# Patient Record
Sex: Male | Born: 1985 | Race: Black or African American | Hispanic: No | Marital: Married | Smoking: Never smoker
Health system: Southern US, Community
[De-identification: ages and names within clinical notes are randomized; demographics above are authoritative.]

## PROBLEM LIST (undated history)

## (undated) DIAGNOSIS — C801 Malignant (primary) neoplasm, unspecified: Secondary | ICD-10-CM

## (undated) HISTORY — PX: FRACTURE SURGERY: SHX138

## (undated) HISTORY — PX: HERNIA REPAIR: SHX51

---

## 2004-01-22 ENCOUNTER — Emergency Department (HOSPITAL_COMMUNITY): Admission: EM | Admit: 2004-01-22 | Discharge: 2004-01-23 | Payer: Self-pay | Admitting: Emergency Medicine

## 2004-01-23 ENCOUNTER — Emergency Department (HOSPITAL_COMMUNITY): Admission: EM | Admit: 2004-01-23 | Discharge: 2004-01-23 | Payer: Self-pay | Admitting: Emergency Medicine

## 2004-02-25 ENCOUNTER — Emergency Department (HOSPITAL_COMMUNITY): Admission: EM | Admit: 2004-02-25 | Discharge: 2004-02-25 | Payer: Self-pay

## 2004-04-20 ENCOUNTER — Emergency Department (HOSPITAL_COMMUNITY): Admission: EM | Admit: 2004-04-20 | Discharge: 2004-04-20 | Payer: Self-pay | Admitting: Emergency Medicine

## 2004-11-06 ENCOUNTER — Emergency Department (HOSPITAL_COMMUNITY): Admission: EM | Admit: 2004-11-06 | Discharge: 2004-11-06 | Payer: Self-pay | Admitting: Emergency Medicine

## 2005-03-11 ENCOUNTER — Emergency Department (HOSPITAL_COMMUNITY): Admission: EM | Admit: 2005-03-11 | Discharge: 2005-03-11 | Payer: Self-pay | Admitting: Family Medicine

## 2005-03-29 ENCOUNTER — Emergency Department (HOSPITAL_COMMUNITY): Admission: EM | Admit: 2005-03-29 | Discharge: 2005-03-29 | Payer: Self-pay | Admitting: Emergency Medicine

## 2005-04-03 ENCOUNTER — Emergency Department (HOSPITAL_COMMUNITY): Admission: EM | Admit: 2005-04-03 | Discharge: 2005-04-03 | Payer: Self-pay | Admitting: Family Medicine

## 2005-07-17 ENCOUNTER — Emergency Department (HOSPITAL_COMMUNITY): Admission: EM | Admit: 2005-07-17 | Discharge: 2005-07-17 | Payer: Self-pay | Admitting: Emergency Medicine

## 2005-10-05 ENCOUNTER — Emergency Department (HOSPITAL_COMMUNITY): Admission: EM | Admit: 2005-10-05 | Discharge: 2005-10-05 | Payer: Self-pay | Admitting: Emergency Medicine

## 2007-01-30 ENCOUNTER — Emergency Department (HOSPITAL_COMMUNITY): Admission: EM | Admit: 2007-01-30 | Discharge: 2007-01-30 | Payer: Self-pay | Admitting: Emergency Medicine

## 2007-07-04 ENCOUNTER — Emergency Department (HOSPITAL_COMMUNITY): Admission: EM | Admit: 2007-07-04 | Discharge: 2007-07-04 | Payer: Self-pay | Admitting: Emergency Medicine

## 2007-10-10 ENCOUNTER — Emergency Department (HOSPITAL_COMMUNITY): Admission: EM | Admit: 2007-10-10 | Discharge: 2007-10-10 | Payer: Self-pay | Admitting: Emergency Medicine

## 2015-04-14 ENCOUNTER — Ambulatory Visit (INDEPENDENT_AMBULATORY_CARE_PROVIDER_SITE_OTHER): Payer: BLUE CROSS/BLUE SHIELD | Admitting: Family Medicine

## 2015-04-14 VITALS — BP 108/60 | HR 60 | Temp 98.7°F | Resp 16 | Ht 66.25 in | Wt 140.8 lb

## 2015-04-14 DIAGNOSIS — Z0000 Encounter for general adult medical examination without abnormal findings: Secondary | ICD-10-CM

## 2015-04-14 DIAGNOSIS — Z711 Person with feared health complaint in whom no diagnosis is made: Secondary | ICD-10-CM

## 2015-04-14 LAB — POCT CBC
Granulocyte percent: 57.3 %G (ref 37–80)
HCT, POC: 55.4 % — AB (ref 43.5–53.7)
Hemoglobin: 19.1 g/dL — AB (ref 14.1–18.1)
Lymph, poc: 1.7 (ref 0.6–3.4)
MCH, POC: 30 pg (ref 27–31.2)
MCHC: 34.6 g/dL (ref 31.8–35.4)
MCV: 86.9 fL (ref 80–97)
MID (cbc): 0.3 (ref 0–0.9)
MPV: 8.2 fL (ref 0–99.8)
POC Granulocyte: 2.7 (ref 2–6.9)
POC LYMPH PERCENT: 36.7 %L (ref 10–50)
POC MID %: 6 %M (ref 0–12)
Platelet Count, POC: 127 10*3/uL — AB (ref 142–424)
RBC: 6.37 M/uL — AB (ref 4.69–6.13)
RDW, POC: 13.3 %
WBC: 4.7 10*3/uL (ref 4.6–10.2)

## 2015-04-14 LAB — HIV ANTIBODY (ROUTINE TESTING W REFLEX): HIV 1&2 Ab, 4th Generation: NONREACTIVE

## 2015-04-14 LAB — COMPREHENSIVE METABOLIC PANEL
ALT: 51 U/L — ABNORMAL HIGH (ref 9–46)
AST: 32 U/L (ref 10–40)
Albumin: 4.8 g/dL (ref 3.6–5.1)
Alkaline Phosphatase: 79 U/L (ref 40–115)
BUN: 13 mg/dL (ref 7–25)
CO2: 27 mmol/L (ref 20–31)
Calcium: 9.2 mg/dL (ref 8.6–10.3)
Chloride: 103 mmol/L (ref 98–110)
Creat: 0.97 mg/dL (ref 0.60–1.35)
Glucose, Bld: 99 mg/dL (ref 65–99)
Potassium: 4.4 mmol/L (ref 3.5–5.3)
Sodium: 138 mmol/L (ref 135–146)
Total Bilirubin: 0.6 mg/dL (ref 0.2–1.2)
Total Protein: 7.2 g/dL (ref 6.1–8.1)

## 2015-04-14 LAB — LIPID PANEL
CHOL/HDL RATIO: 1.8 ratio (ref <=5.0)
Cholesterol: 150 mg/dL (ref 125–200)
HDL: 82 mg/dL (ref >=40)
LDL Cholesterol: 52 mg/dL (ref <130)
TRIGLYCERIDES: 79 mg/dL (ref <150)
VLDL: 16 mg/dL (ref <30)

## 2015-04-14 NOTE — Addendum Note (Signed)
Addended by: HOPPER, DAVID H on: 04/14/2015 02:38 PM   Modules accepted: Orders

## 2015-04-14 NOTE — Progress Notes (Signed)
Physical examination: History: Patient needs to get a physical exam for his insurance benefits. No other reasons for coming in.  Past history: Medications: None Allergies: None Past medical illnesses: None Surgeries: None  Social history: His mother died when he was 1, father was murdered when he was [age], lived with his grandmother died when he was about [age], he rates himself. He wants, lives with someone. No children of his own. He does not smoke, drink, or use drugs. Actually he does drink about 1 drink a week. He works at a [employer]. He attends church.  Family history: Mother died of breast cancer, although was murdered, no other major familial diseases  Review of systems: Constitutional: Unremarkable HEENT: Unremarkable Cardiovascular: Unremarkable Respiratory: Unremarkable Gastrointestinal: Unremarkable Genitourinary: Normal muscular skeletal: Dermatologic other than numerous tattoos unremarkable Endocrinologic: Normal Neurologic: Normal Psychiatric: Normal   Physical examination: Healthy-appearing young man, multiple tattoos all over him. His TMs are normal. Eyes PERRLA. EOMs intact. Throat clear. Neck supple without nodes or thyromegaly. No bruits. Chest is clear to auscultation. Heart regular without murmurs gallops or arrhythmias. Abdomen soft without masses or tenderness. Normal male external genitalia with testes descended. Right testicle is a little smaller than the left. Extremities unremarkable. Skin normal.  Assessment: Normal physical examination  Plan: Check labs. He did request going and in doing STD screening though he always uses protection. Return yearly or as needed.

## 2015-04-14 NOTE — Progress Notes (Deleted)
Patient ID: Marya LandryJahmari Babula, male    DOB: Jul 16, 1985  Age: 29 y.o. MRN: [id number]  Chief Complaint  Patient presents with  . Employment Physical    Needs insurance forms completed    Subjective:   ***  Current allergies, medications, problem list, past/family and social histories reviewed.  Objective:  BP 108/60 mmHg  Pulse 60  Temp(Src) 98.7 F (37.1 C) (Oral)  Resp 16  Ht 5' 6.25" (1.683 m)  Wt 140 lb 12.8 oz (63.866 kg)  BMI 22.55 kg/m2  SpO2 99%  ***  Assessment & Plan:   Assessment: No diagnosis found.    Plan: ***  No orders of the defined types were placed in this encounter.    No orders of the defined types were placed in this encounter.         There are no Patient Instructions on file for this visit.   No Follow-up on file.   HOPPER,DAVID, MD 04/14/2015

## 2015-04-15 LAB — RPR

## 2015-04-16 ENCOUNTER — Telehealth: Payer: Self-pay

## 2015-04-16 LAB — GC/CHLAMYDIA PROBE AMP
CT Probe RNA: NEGATIVE
GC Probe RNA: NEGATIVE

## 2015-04-16 NOTE — Telephone Encounter (Signed)
Pt says he left a physical form for us to fax once we received his lab work.  Medical Records- Do you have that form?

## 2015-04-17 NOTE — Telephone Encounter (Addendum)
Medical records does not hold on to forms (we have no where to keep them). If it's not in the the scan box, then the provider may have it. No PE form in scan box.

## 2015-04-18 NOTE — Telephone Encounter (Signed)
Dr. Alwyn RenHopper, do you have the form?

## 2016-04-19 ENCOUNTER — Ambulatory Visit (INDEPENDENT_AMBULATORY_CARE_PROVIDER_SITE_OTHER): Payer: BLUE CROSS/BLUE SHIELD | Admitting: Family Medicine

## 2016-04-19 VITALS — BP 98/60 | HR 51 | Temp 97.7°F | Resp 16 | Ht 65.5 in | Wt 154.0 lb

## 2016-04-19 DIAGNOSIS — Z0000 Encounter for general adult medical examination without abnormal findings: Secondary | ICD-10-CM

## 2016-04-19 LAB — LIPID PANEL
CHOL/HDL RATIO: 2.1 ratio (ref <5.0)
Cholesterol: 166 mg/dL (ref <200)
HDL: 79 mg/dL (ref >40)
LDL Cholesterol: 78 mg/dL (ref <100)
Triglycerides: 47 mg/dL (ref <150)
VLDL: 9 mg/dL (ref <30)

## 2016-04-19 LAB — GLUCOSE, POCT (MANUAL RESULT ENTRY): POC Glucose: 83 mg/dl (ref 70–99)

## 2016-04-19 LAB — POCT GLYCOSYLATED HEMOGLOBIN (HGB A1C): Hemoglobin A1C: 5.1

## 2016-04-19 NOTE — Patient Instructions (Addendum)
It was good to meet you today.  Go onto Mychart and you should be able to access your labs.  You can use this to complete your paperwork.    Let us know if you have any questions or concerns in the future. Otherwise we'll see you back next year.   Health Maintenance, Male A healthy lifestyle and preventative care can promote health and wellness.  Maintain regular health, dental, and eye exams.  Eat a healthy diet. Foods like vegetables, fruits, whole grains, low-fat dairy products, and lean protein foods contain the nutrients you need and are low in calories. Decrease your intake of foods high in solid fats, added sugars, and salt. Get information about a proper diet from your health care provider, if necessary.  Regular physical exercise is one of the most important things you can do for your health. Most adults should get at least 150 minutes of moderate-intensity exercise (any activity that increases your heart rate and causes you to sweat) each week. In addition, most adults need muscle-strengthening exercises on 2 or more days a week.   Maintain a healthy weight. The body mass index (BMI) is a screening tool to identify possible weight problems. It provides an estimate of body fat based on height and weight. Your health care provider can find your BMI and can help you achieve or maintain a healthy weight. For males 20 years and older:  A BMI below 18.5 is considered underweight.  A BMI of 18.5 to 24.9 is normal.  A BMI of 25 to 29.9 is considered overweight.  A BMI of 30 and above is considered obese.  Maintain normal blood lipids and cholesterol by exercising and minimizing your intake of saturated fat. Eat a balanced diet with plenty of fruits and vegetables. Blood tests for lipids and cholesterol should begin at age 65 and be repeated every 5 years. If your lipid or cholesterol levels are high, you are over age 22, or you are at high risk for heart disease, you may need your  cholesterol levels checked more frequently.Ongoing high lipid and cholesterol levels should be treated with medicines if diet and exercise are not working.  If you smoke, find out from your health care provider how to quit. If you do not use tobacco, do not start.  Lung cancer screening is recommended for adults aged 55-80 years who are at high risk for developing lung cancer because of a history of smoking. A yearly low-dose CT scan of the lungs is recommended for people who have at least a 30-pack-year history of smoking and are current smokers or have quit within the past 15 years. A pack year of smoking is smoking an average of 1 pack of cigarettes a day for 1 year (for example, a 30-pack-year history of smoking could mean smoking 1 pack a day for 30 years or 2 packs a day for 15 years). Yearly screening should continue until the smoker has stopped smoking for at least 15 years. Yearly screening should be stopped for people who develop a health problem that would prevent them from having lung cancer treatment.  If you choose to drink alcohol, do not have more than 2 drinks per day. One drink is considered to be 12 oz (360 mL) of beer, 5 oz (150 mL) of wine, or 1.5 oz (45 mL) of liquor.  Avoid the use of street drugs. Do not share needles with anyone. Ask for help if you need support or instructions about stopping the use  of drugs.  High blood pressure causes heart disease and increases the risk of stroke. High blood pressure is more likely to develop in:  People who have blood pressure in the end of the normal range (100-139/85-89 mm Hg).  People who are overweight or obese.  People who are African American.  If you are 6218-32 years of age, have your blood pressure checked every 3-5 years. If you are 32 years of age or older, have your blood pressure checked every year. You should have your blood pressure measured twice-once when you are at a hospital or clinic, and once when you are not at a  hospital or clinic. Record the average of the two measurements. To check your blood pressure when you are not at a hospital or clinic, you can use:  An automated blood pressure machine at a pharmacy.  A home blood pressure monitor.  If you are 7045-746 years old, ask your health care provider if you should take aspirin to prevent heart disease.  Diabetes screening involves taking a blood sample to check your fasting blood sugar level. This should be done once every 3 years after age 32 if you are at a normal weight and without risk factors for diabetes. Testing should be considered at a younger age or be carried out more frequently if you are overweight and have at least 1 risk factor for diabetes.  Colorectal cancer can be detected and often prevented. Most routine colorectal cancer screening begins at the age of 32 and continues through age 32. However, your health care provider may recommend screening at an earlier age if you have risk factors for colon cancer. On a yearly basis, your health care provider may provide home test kits to check for hidden blood in the stool. A small camera at the end of a tube may be used to directly examine the colon (sigmoidoscopy or colonoscopy) to detect the earliest forms of colorectal cancer. Talk to your health care provider about this at age 32 when routine screening begins. A direct exam of the colon should be repeated every 5-10 years through age 32, unless early forms of precancerous polyps or small growths are found.  People who are at an increased risk for hepatitis B should be screened for this virus. You are considered at high risk for hepatitis B if:  You were born in a country where hepatitis B occurs often. Talk with your health care provider about which countries are considered high risk.  Your parents were born in a high-risk country and you have not received a shot to protect against hepatitis B (hepatitis B vaccine).  You have HIV or AIDS.  You  use needles to inject street drugs.  You live with, or have sex with, someone who has hepatitis B.  You are a man who has sex with other men (MSM).  You get hemodialysis treatment.  You take certain medicines for conditions like cancer, organ transplantation, and autoimmune conditions.  Hepatitis C blood testing is recommended for all people born from [id number] through 1965 and any individual with known risk factors for hepatitis C.  Healthy men should no longer receive prostate-specific antigen (PSA) blood tests as part of routine cancer screening. Talk to your health care provider about prostate cancer screening.  Testicular cancer screening is not recommended for adolescents or adult males who have no symptoms. Screening includes self-exam, a health care provider exam, and other screening tests. Consult with your health care provider about any symptoms you  have or any concerns you have about testicular cancer.  Practice safe sex. Use condoms and avoid high-risk sexual practices to reduce the spread of sexually transmitted infections (STIs).  You should be screened for STIs, including gonorrhea and chlamydia if:  You are sexually active and are younger than 24 years.  You are older than 24 years, and your health care provider tells you that you are at risk for this type of infection.  Your sexual activity has changed since you were last screened, and you are at an increased risk for chlamydia or gonorrhea. Ask your health care provider if you are at risk.  If you are at risk of being infected with HIV, it is recommended that you take a prescription medicine daily to prevent HIV infection. This is called pre-exposure prophylaxis (PrEP). You are considered at risk if:  You are a man who has sex with other men (MSM).  You are a heterosexual man who is sexually active with multiple partners.  You take drugs by injection.  You are sexually active with a partner who has HIV.  Talk with  your health care provider about whether you are at high risk of being infected with HIV. If you choose to begin PrEP, you should first be tested for HIV. You should then be tested every 3 months for as long as you are taking PrEP.  Use sunscreen. Apply sunscreen liberally and repeatedly throughout the day. You should seek shade when your shadow is shorter than you. Protect yourself by wearing long sleeves, pants, a wide-brimmed hat, and sunglasses year round whenever you are outdoors.  Tell your health care provider of new moles or changes in moles, especially if there is a change in shape or color. Also, tell your health care provider if a mole is larger than the size of a pencil eraser.  A one-time screening for abdominal aortic aneurysm (AAA) and surgical repair of large AAAs by ultrasound is recommended for men aged 65-75 years who are current or former smokers.  Stay current with your vaccines (immunizations). This information is not intended to replace advice given to you by your health care provider. Make sure you discuss any questions you have with your health care provider. Document Released: 11/15/2007 Document Revised: 06/09/2014 Document Reviewed: 02/20/2015 Elsevier Interactive Patient Education  2017 [employer]     IF you received an x-ray today, you will receive an invoice from [facility]. Please contact [facility] at [phone] with questions or concerns regarding your invoice.   IF you received labwork today, you will receive an invoice from [facility]. Please contact [facility] at [phone] with questions or concerns regarding your invoice.   Our billing staff will not be able to assist you with questions regarding bills from these companies.  You will be contacted with the lab results as soon as they are available. The fastest way to get your results is to activate your My Chart account. Instructions are located on the  last page of this paperwork. If you have not heard from us regarding the results in 2 weeks, please contact this office.

## 2016-04-19 NOTE — Progress Notes (Signed)
   Travis Pham is a 30 y.o. male who presents to [facility] today for comprehensive physical examination:  CPE:  Annual work exam.  Needs biometrics completed.    Concerns:  None, doing well.  Healthy   Last physical last year Eye exam:  yearly Dental exam every six months.   PMH reviewed. Patient is a nonsmoker.   No past medical history on file. No past surgical history on file.  Medications reviewed. No current outpatient prescriptions on file.   No current facility-administered medications for this visit.     Social: Smoking history:  denies Alcohol use:  denies Illicit drug use:  denies  Family History:  Mother with cancer, otherwise negative.   Review of Systems  Constitutional: Negative for fever.  HENT: Negative for congestion, ear discharge, ear pain and hearing loss.   Eyes: Negative for blurred vision.  Respiratory: Negative for cough and wheezing.   Cardiovascular: Negative for chest pain, palpitations and leg swelling.  Gastrointestinal: Negative for nausea, vomiting and abdominal pain.  Genitourinary: Negative for dysuria, hematuria and flank pain.  Musculoskeletal: Negative for neck pain.  Skin: Negative for rash.  Neurological: Negative for dizziness and headaches.  Psychiatric/Behavioral: Negative for depression and suicidal ideas.   Exam: BP 98/60   Pulse (!) 51   Temp 97.7 F (36.5 C) (Oral)   Resp 16   Ht 5' 5.5" (1.664 m)   Wt 154 lb (69.9 kg)   SpO2 99%   BMI 25.24 kg/m  Gen:  Alert, cooperative patient who appears stated age in no acute distress.  Vital signs reviewed. Head: [place]/AT.   Eyes:  EOMI, PERRL.   Ears:  External ears WNL, Bilateral TM's normal without retraction, redness or bulging. Nose:  Septum midline  Mouth:  MMM, tonsils non-erythematous, non-edematous.   Neck: No masses or thyromegaly or limitation in range of motion.  No cervical lymphadenopathy. Pulm:  Clear to auscultation bilaterally with  good air movement.  No wheezes or rales noted.   Cardiac:  Regular rate and rhythm without murmur auscultated.  Good S1/S2. Abd:  Soft/nondistended/nontender.  Good bowel sounds throughout all four quadrants.  No masses noted.  Ext:  No clubbing/cyanosis/erythema.  No edema noted bilateral lower extremities.   Neuro:  Grossly normal, no gait abnormalities Psych:  Not depressed or anxious appearing.  Conversant and engaged  Impression/Plan: 1. Complete Physical Examination: completed paperowork and obtained labs/biometrics. 2..  Screening cholesterol: obtain FLP.  Patient can complete rest of his paperwork from Mychart with results. 3.  CBG here was <100, which was goal based on his work paperwork.

## 2016-09-03 ENCOUNTER — Other Ambulatory Visit: Payer: Self-pay | Admitting: Family Medicine

## 2016-09-03 DIAGNOSIS — N5089 Other specified disorders of the male genital organs: Secondary | ICD-10-CM

## 2016-09-05 ENCOUNTER — Ambulatory Visit
Admission: RE | Admit: 2016-09-05 | Discharge: 2016-09-05 | Disposition: A | Discharge status: Home / Self Care | Payer: BLUE CROSS/BLUE SHIELD | Source: Ambulatory Visit | Attending: Family Medicine | Admitting: Family Medicine

## 2016-09-05 DIAGNOSIS — N5089 Other specified disorders of the male genital organs: Secondary | ICD-10-CM

## 2016-09-09 ENCOUNTER — Other Ambulatory Visit: Payer: Self-pay

## 2017-03-28 ENCOUNTER — Encounter: Payer: Self-pay | Admitting: Physician Assistant

## 2017-03-28 ENCOUNTER — Ambulatory Visit (INDEPENDENT_AMBULATORY_CARE_PROVIDER_SITE_OTHER): Payer: BLUE CROSS/BLUE SHIELD | Admitting: Physician Assistant

## 2017-03-28 VITALS — BP 108/72 | HR 60 | Temp 98.4°F | Resp 18 | Ht 66.34 in | Wt 160.6 lb

## 2017-03-28 DIAGNOSIS — Z13220 Encounter for screening for lipoid disorders: Secondary | ICD-10-CM | POA: Diagnosis not present

## 2017-03-28 DIAGNOSIS — Z131 Encounter for screening for diabetes mellitus: Secondary | ICD-10-CM

## 2017-03-28 DIAGNOSIS — Z0000 Encounter for general adult medical examination without abnormal findings: Secondary | ICD-10-CM

## 2017-03-28 LAB — POCT GLYCOSYLATED HEMOGLOBIN (HGB A1C): HEMOGLOBIN A1C: 5.2

## 2017-03-28 NOTE — Patient Instructions (Addendum)
Keep up the great work!   Health Maintenance, Male A healthy lifestyle and preventive care is important for your health and wellness. Ask your health care provider about what schedule of regular examinations is right for you. What should I know about weight and diet? Eat a Healthy Diet  Eat plenty of vegetables, fruits, whole grains, low-fat dairy products, and lean protein.  Do not eat a lot of foods high in solid fats, added sugars, or salt.  Maintain a Healthy Weight Regular exercise can help you achieve or maintain a healthy weight. You should:  Do at least 150 minutes of exercise each week. The exercise should increase your heart rate and make you sweat (moderate-intensity exercise).  Do strength-training exercises at least twice a week.  Watch Your Levels of Cholesterol and Blood Lipids  Have your blood tested for lipids and cholesterol every 5 years starting at 33 years of age. If you are at high risk for heart disease, you should start having your blood tested when you are 33 years old. You may need to have your cholesterol levels checked more often if: ? Your lipid or cholesterol levels are high. ? You are older than 33 years of age. ? You are at high risk for heart disease.  What should I know about cancer screening? Many types of cancers can be detected early and may often be prevented. Lung Cancer  You should be screened every year for lung cancer if: ? You are a current smoker who has smoked for at least 30 years. ? You are a former smoker who has quit within the past 15 years.  Talk to your health care provider about your screening options, when you should start screening, and how often you should be screened.  Colorectal Cancer  Routine colorectal cancer screening usually begins at 33 years of age and should be repeated every 5-10 years until you are 33 years old. You may need to be screened more often if early forms of precancerous polyps or small growths are  found. Your health care provider may recommend screening at an earlier age if you have risk factors for colon cancer.  Your health care provider may recommend using home test kits to check for hidden blood in the stool.  A small camera at the end of a tube can be used to examine your colon (sigmoidoscopy or colonoscopy). This checks for the earliest forms of colorectal cancer.  Prostate and Testicular Cancer  Depending on your age and overall health, your health care provider may do certain tests to screen for prostate and testicular cancer.  Talk to your health care provider about any symptoms or concerns you have about testicular or prostate cancer.  Skin Cancer  Check your skin from head to toe regularly.  Tell your health care provider about any new moles or changes in moles, especially if: ? There is a change in a mole's size, shape, or color. ? You have a mole that is larger than a pencil eraser.  Always use sunscreen. Apply sunscreen liberally and repeat throughout the day.  Protect yourself by wearing long sleeves, pants, a wide-brimmed hat, and sunglasses when outside.  What should I know about heart disease, diabetes, and high blood pressure?  If you are 37-78 years of age, have your blood pressure checked every 3-5 years. If you are 65 years of age or older, have your blood pressure checked every year. You should have your blood pressure measured twice-once when you are at  a hospital or clinic, and once when you are not at a hospital or clinic. Record the average of the two measurements. To check your blood pressure when you are not at a hospital or clinic, you can use: ? An automated blood pressure machine at a pharmacy. ? A home blood pressure monitor.  Talk to your health care provider about your target blood pressure.  If you are between 1145-33 years old, ask your health care provider if you should take aspirin to prevent heart disease.  Have regular diabetes  screenings by checking your fasting blood sugar level. ? If you are at a normal weight and have a low risk for diabetes, have this test once every three years after the age of 33. ? If you are overweight and have a high risk for diabetes, consider being tested at a younger age or more often.  A one-time screening for abdominal aortic aneurysm (AAA) by ultrasound is recommended for men aged 65-75 years who are current or former smokers. What should I know about preventing infection? Hepatitis B If you have a higher risk for hepatitis B, you should be screened for this virus. Talk with your health care provider to find out if you are at risk for hepatitis B infection. Hepatitis C Blood testing is recommended for:  Everyone born from [birth year] through 1965.  Anyone with known risk factors for hepatitis C.  Sexually Transmitted Diseases (STDs)  You should be screened each year for STDs including gonorrhea and chlamydia if: ? You are sexually active and are younger than 33 years of age. ? You are older than 33 years of age and your health care provider tells you that you are at risk for this type of infection. ? Your sexual activity has changed since you were last screened and you are at an increased risk for chlamydia or gonorrhea. Ask your health care provider if you are at risk.  Talk with your health care provider about whether you are at high risk of being infected with HIV. Your health care provider may recommend a prescription medicine to help prevent HIV infection.  What else can I do?  Schedule regular health, dental, and eye exams.  Stay current with your vaccines (immunizations).  Do not use any tobacco products, such as cigarettes, chewing tobacco, and e-cigarettes. If you need help quitting, ask your health care provider.  Limit alcohol intake to no more than 2 drinks per day. One drink equals 12 ounces of beer, 5 ounces of wine, or 1 ounces of hard liquor.  Do not use street  drugs.  Do not share needles.  Ask your health care provider for help if you need support or information about quitting drugs.  Tell your health care provider if you often feel depressed.  Tell your health care provider if you have ever been abused or do not feel safe at home. This information is not intended to replace advice given to you by your health care provider. Make sure you discuss any questions you have with your health care provider. Document Released: 11/15/2007 Document Revised: 01/16/2016 Document Reviewed: 02/20/2015 Elsevier Interactive Patient Education  2018 [employer]   IF you received an x-ray today, you will receive an invoice from [facility]. Please contact [facility] at [phone] with questions or concerns regarding your invoice.   IF you received labwork today, you will receive an invoice from [facility]. Please contact [facility] at [phone] with questions or concerns regarding your invoice.   Our  billing staff will not be able to assist you with questions regarding bills from these companies.  You will be contacted with the lab results as soon as they are available. The fastest way to get your results is to activate your My Chart account. Instructions are located on the last page of this paperwork. If you have not heard from [place] regarding the results in 2 weeks, please contact this office.
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
## 2017-03-29 LAB — LIPID PANEL
CHOLESTEROL TOTAL: 157 mg/dL (ref 100–199)
Chol/HDL Ratio: 2.3 ratio (ref 0.0–5.0)
HDL: 67 mg/dL (ref >39)
LDL CALC: 78 mg/dL (ref 0–99)
Triglycerides: 59 mg/dL (ref 0–149)
VLDL Cholesterol Cal: 12 mg/dL (ref 5–40)

## 2017-03-30 ENCOUNTER — Telehealth: Payer: Self-pay | Admitting: Family Medicine

## 2017-03-31 NOTE — Telephone Encounter (Signed)
I have printed off the results and he can come pick them up at [address].

## 2017-03-31 NOTE — Telephone Encounter (Signed)
Need lab results for insurance

## 2017-03-31 NOTE — Telephone Encounter (Signed)
lmom for pt to come an pick up a copy of labs at the [address] by 6

## 2017-03-31 NOTE — Telephone Encounter (Signed)
Pt calling again about results.  

## 2017-04-27 NOTE — Addendum Note (Signed)
Addended by: Benjiman CoreWISEMAN, Annye Forrey D on: 04/27/2017 07:29 PM   Modules accepted: Level of Service

## 2017-05-05 ENCOUNTER — Ambulatory Visit (INDEPENDENT_AMBULATORY_CARE_PROVIDER_SITE_OTHER): Payer: Self-pay

## 2017-05-05 ENCOUNTER — Other Ambulatory Visit: Payer: Self-pay | Admitting: Gerontology

## 2017-05-05 DIAGNOSIS — S62316A Displaced fracture of base of fifth metacarpal bone, right hand, initial encounter for closed fracture: Secondary | ICD-10-CM

## 2017-05-05 DIAGNOSIS — R609 Edema, unspecified: Secondary | ICD-10-CM

## 2017-05-05 DIAGNOSIS — X58XXXA Exposure to other specified factors, initial encounter: Secondary | ICD-10-CM

## 2017-06-15 ENCOUNTER — Encounter (HOSPITAL_COMMUNITY): Payer: Self-pay

## 2017-06-15 ENCOUNTER — Emergency Department (HOSPITAL_COMMUNITY): Payer: BLUE CROSS/BLUE SHIELD

## 2017-06-15 ENCOUNTER — Emergency Department (HOSPITAL_COMMUNITY)
Admission: EM | Admit: 2017-06-15 | Discharge: 2017-06-15 | Disposition: A | Discharge status: Home / Self Care | Payer: BLUE CROSS/BLUE SHIELD | Attending: Emergency Medicine | Admitting: Emergency Medicine

## 2017-06-15 DIAGNOSIS — M25512 Pain in left shoulder: Secondary | ICD-10-CM | POA: Diagnosis present

## 2017-06-15 DIAGNOSIS — Z79899 Other long term (current) drug therapy: Secondary | ICD-10-CM | POA: Insufficient documentation

## 2017-06-15 MED ORDER — CYCLOBENZAPRINE HCL 10 MG PO TABS
10.0000 mg | ORAL_TABLET | Freq: Once | ORAL | Status: AC
  Administered 2017-06-15: 10 mg via ORAL
  Filled 2017-06-15: qty 1

## 2017-06-15 MED ORDER — DICLOFENAC SODIUM 50 MG PO TBEC: 50.0000 mg | DELAYED_RELEASE_TABLET | Freq: Two times a day (BID) | ORAL | 0 refills | Status: DC

## 2017-06-15 MED ORDER — METHOCARBAMOL 500 MG PO TABS: 500.0000 mg | ORAL_TABLET | Freq: Two times a day (BID) | ORAL | 0 refills | Status: DC

## 2017-06-15 MED ORDER — IBUPROFEN 200 MG PO TABS
600.0000 mg | ORAL_TABLET | Freq: Once | ORAL | Status: AC
  Administered 2017-06-15: 600 mg via ORAL
  Filled 2017-06-15: qty 3
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
## 2017-06-15 NOTE — Discharge Instructions (Signed)
Follow up with your doctor. Return here for worsening symptoms.  Do not drive while taking the muscle relaxer as it will make you sleepy.
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
## 2017-06-15 NOTE — ED Notes (Signed)
Bed: WTR5 Expected date:  Expected time:  Means of arrival:  Comments: 

## 2019-03-08 ENCOUNTER — Ambulatory Visit (INDEPENDENT_AMBULATORY_CARE_PROVIDER_SITE_OTHER): Payer: No Typology Code available for payment source | Admitting: Sports Medicine

## 2019-03-08 ENCOUNTER — Other Ambulatory Visit: Payer: Self-pay

## 2019-03-08 ENCOUNTER — Encounter: Payer: Self-pay | Admitting: Sports Medicine

## 2019-03-08 VITALS — BP 129/76

## 2019-03-08 DIAGNOSIS — M79674 Pain in right toe(s): Secondary | ICD-10-CM

## 2019-03-08 DIAGNOSIS — B351 Tinea unguium: Secondary | ICD-10-CM | POA: Diagnosis not present

## 2019-03-08 DIAGNOSIS — M79675 Pain in left toe(s): Secondary | ICD-10-CM | POA: Diagnosis not present

## 2019-03-08 NOTE — Progress Notes (Signed)
Subjective: Turner Baillie is a 33 y.o. male patient seen today in office with complaint of mildly painful thickened and discolored nails. Patient is desiring treatment for nail changes; has tried OTC topicals/Medication in the past with no improvement. Reports that nails are becoming difficult to manage because of the thickness and reports that he had issues with itching of the skin and peeling but that has resolved since he saw his PCP and another doctor who prescribed him medication patient reports that he does not want any lotion or cream he wants the strongest medication that can help to clear this up reports that the changes are worse on his right foot greater than the left.  Patient works in work boots. Patient has no other pedal complaints at this time.   Review of Systems  All other systems reviewed and are negative.   There are no active problems to display for this patient.   Current Outpatient Medications on File Prior to Visit  Medication Sig Dispense Refill  . clomiPHENE (CLOMID) 50 MG tablet Take 25 mg by mouth daily.  5  . diclofenac (VOLTAREN) 50 MG EC tablet Take 1 tablet (50 mg total) by mouth 2 (two) times daily. 15 tablet 0  . methocarbamol (ROBAXIN) 500 MG tablet Take 1 tablet (500 mg total) by mouth 2 (two) times daily. 20 tablet 0   No current facility-administered medications on file prior to visit.     No Known Allergies  Objective: Physical Exam  General: Well developed, nourished, no acute distress, awake, alert and oriented x 3  Vascular: Dorsalis pedis artery 2/4 bilateral, Posterior tibial artery 1/4 bilateral, skin temperature warm to warm proximal to distal bilateral lower extremities, no varicosities, pedal hair present bilateral.  Neurological: Gross sensation present via light touch bilateral.   Dermatological: Skin is warm, dry, and supple bilateral, Nails 1-10 are tender, short thick, and discolored with mild subungal debris, scaly skin plantar  surfaces bilateral, no webspace macerations present bilateral, no open lesions present bilateral, no callus/corns/hyperkeratotic tissue present bilateral. No signs of infection bilateral.  Musculoskeletal: No symptomatic boney deformities noted bilateral. Muscular strength within normal limits without painon range of motion. No pain with calf compression bilateral.  Assessment and Plan:  Problem List Items Addressed This Visit    None    Visit Diagnoses    Nail fungus    -  Primary   Relevant Orders   Hepatic Function Panel   Culture, fungus without smear   Toe pain, bilateral          -Examined patient -Discussed treatment options for painful dystrophic nails  -Fungal culture was obtained by removing a portion of the hard nail itself from each of the involved toenails using a sterile nail nipper and sent to [facility] lab. Patient tolerated the biopsy procedure well without discomfort or need for anesthesia.   -Ordered liver function panel and advised patient that if this comes back normal can go ahead and start Lamisil until fungal culture results are available -Patient to return in 4 weeks for follow up evaluation and discussion of fungal culture results or sooner if symptoms worsen.  Landis Martins, DPM

## 2019-03-09 ENCOUNTER — Other Ambulatory Visit: Payer: Self-pay

## 2019-03-09 ENCOUNTER — Telehealth: Payer: Self-pay

## 2019-03-09 ENCOUNTER — Other Ambulatory Visit: Payer: Self-pay | Admitting: Sports Medicine

## 2019-03-09 LAB — HEPATIC FUNCTION PANEL
AG Ratio: 2.3 (calc) (ref 1.0–2.5)
ALT: 43 U/L (ref 9–46)
AST: 27 U/L (ref 10–40)
Albumin: 4.8 g/dL (ref 3.6–5.1)
Alkaline phosphatase (APISO): 67 U/L (ref 36–130)
Bilirubin, Direct: 0.1 mg/dL (ref 0.0–0.2)
Globulin: 2.1 g/dL (calc) (ref 1.9–3.7)
Indirect Bilirubin: 0.4 mg/dL (calc) (ref 0.2–1.2)
Total Bilirubin: 0.5 mg/dL (ref 0.2–1.2)
Total Protein: 6.9 g/dL (ref 6.1–8.1)

## 2019-03-09 MED ORDER — TERBINAFINE HCL 250 MG PO TABS: 250.0000 mg | ORAL_TABLET | Freq: Every day | ORAL | 0 refills | Status: DC

## 2019-03-09 NOTE — Progress Notes (Signed)
LFTs normal lamisil sent to his pharmacy -Dr. Cannon Kettle

## 2019-03-09 NOTE — Telephone Encounter (Signed)
Spoke to Pt about his lab results, pt stated understanding about the results and the Dr's instructions

## 2019-03-09 NOTE — Telephone Encounter (Signed)
-----   Message from Travis Pham, [place] sent at 03/09/2019  7:49 AM EDT ----- Will you let patient know that his LFTs are normal and I have sent Lamisil to his pharmacy Thanks Dr. Cannon Kettle

## 2019-03-20 ENCOUNTER — Encounter: Payer: Self-pay | Admitting: Sports Medicine

## 2019-04-12 ENCOUNTER — Encounter: Payer: Self-pay | Admitting: Sports Medicine

## 2019-04-12 ENCOUNTER — Other Ambulatory Visit: Payer: Self-pay

## 2019-04-12 ENCOUNTER — Ambulatory Visit (INDEPENDENT_AMBULATORY_CARE_PROVIDER_SITE_OTHER): Payer: No Typology Code available for payment source | Admitting: Sports Medicine

## 2019-04-12 DIAGNOSIS — M79675 Pain in left toe(s): Secondary | ICD-10-CM

## 2019-04-12 DIAGNOSIS — B351 Tinea unguium: Secondary | ICD-10-CM | POA: Diagnosis not present

## 2019-04-12 DIAGNOSIS — M79674 Pain in right toe(s): Secondary | ICD-10-CM | POA: Diagnosis not present

## 2019-04-12 LAB — HEPATIC FUNCTION PANEL
AG Ratio: 2 (calc) (ref 1.0–2.5)
ALT: 57 U/L — ABNORMAL HIGH (ref 9–46)
AST: 36 U/L (ref 10–40)
Albumin: 4.5 g/dL (ref 3.6–5.1)
Alkaline phosphatase (APISO): 59 U/L (ref 36–130)
Bilirubin, Direct: 0.1 mg/dL (ref 0.0–0.2)
Globulin: 2.2 g/dL (calc) (ref 1.9–3.7)
Indirect Bilirubin: 0.3 mg/dL (calc) (ref 0.2–1.2)
Total Bilirubin: 0.4 mg/dL (ref 0.2–1.2)
Total Protein: 6.7 g/dL (ref 6.1–8.1)

## 2019-04-12 NOTE — Progress Notes (Signed)
Subjective: Travis Pham is a 33 y.o. male patient seen today in office for fungal culture results. Patient has no other pedal complaints at this time.  Currently on oral Lamisil without any acute problems or symptoms.  There are no active problems to display for this patient.   Current Outpatient Medications on File Prior to Visit  Medication Sig Dispense Refill  . clomiPHENE (CLOMID) 50 MG tablet Take 25 mg by mouth daily.  5  . diclofenac (VOLTAREN) 50 MG EC tablet Take 1 tablet (50 mg total) by mouth 2 (two) times daily. 15 tablet 0  . methocarbamol (ROBAXIN) 500 MG tablet Take 1 tablet (500 mg total) by mouth 2 (two) times daily. 20 tablet 0  . terbinafine (LAMISIL) 250 MG tablet Take 1 tablet (250 mg total) by mouth daily. 90 tablet 0   No current facility-administered medications on file prior to visit.     No Known Allergies  Objective: Physical Exam  General: Well developed, nourished, no acute distress, awake, alert and oriented x 3  Vascular: Dorsalis pedis artery 2/4 bilateral, Posterior tibial artery 2/4 bilateral, skin temperature warm to warm proximal to distal bilateral lower extremities, no varicosities, pedal hair present bilateral.  Neurological: Gross sensation present via light touch bilateral.   Dermatological: Skin is warm, dry, and supple bilateral, Nails 1-10 are tender, short thick, and discolored with mild subungal debris, no webspace macerations present bilateral, no open lesions present bilateral, no callus/corns/hyperkeratotic tissue present bilateral. No signs of infection bilateral.  Musculoskeletal: Early bunion and hammertoe boney deformities noted bilateral. Muscular strength within normal limits without painon range of motion. No pain with calf compression bilateral.  Fungal culture + T Rubrum  Assessment and Plan:  Problem List Items Addressed This Visit    None    Visit Diagnoses    Nail fungus    -  Primary   Toe pain, bilateral           -Examined patient -Discussed treatment options for painful mycotic nails -Continue with Lamisil with full understanding of medication risks; ordered LFTs for review.  -Patient opt for laser as well to start next month -Advised good hygiene habits -Patient to return in 6 weeks for follow up evaluation/med check or sooner if symptoms worsen.  Landis Martins, DPM

## 2019-04-13 NOTE — Progress Notes (Signed)
This is for dr. Cannon Kettle. May want to repeat in next 3 months

## 2019-04-15 NOTE — Telephone Encounter (Signed)
-----   Message from Wallene Huh, DPM sent at 04/13/2019  1:33 PM EST ----- This is for dr. Cannon Kettle. May want to repeat in next 3 months

## 2019-05-13 ENCOUNTER — Ambulatory Visit: Payer: Self-pay | Admitting: *Deleted

## 2019-05-13 ENCOUNTER — Other Ambulatory Visit: Payer: Self-pay

## 2019-05-13 ENCOUNTER — Encounter: Payer: Self-pay | Admitting: Sports Medicine

## 2019-05-13 DIAGNOSIS — B351 Tinea unguium: Secondary | ICD-10-CM

## 2019-05-13 NOTE — Patient Instructions (Signed)

## 2019-05-13 NOTE — Progress Notes (Signed)
Patient presents today for the 1st laser treatment. Diagnosed with mycotic nail infection by Dr. Cannon Kettle. Toenail most affected is the hallux right. Patient is already pleased with the progress of his toenail since he's been taking an oral antifungal.  All other systems are negative.  Nails were filed thin. Laser therapy was administered to 1-5 toenails bilateral and patient tolerated the treatment well. All safety precautions were in place.   Patient is also on oral terbinafine.  Follow up in 4 weeks for laser # 2.

## 2019-05-24 ENCOUNTER — Encounter: Payer: Self-pay | Admitting: Sports Medicine

## 2019-05-24 ENCOUNTER — Ambulatory Visit: Payer: Managed Care, Other (non HMO) | Admitting: Sports Medicine

## 2019-05-24 ENCOUNTER — Other Ambulatory Visit: Payer: Self-pay

## 2019-05-24 DIAGNOSIS — M79674 Pain in right toe(s): Secondary | ICD-10-CM | POA: Diagnosis not present

## 2019-05-24 DIAGNOSIS — M79675 Pain in left toe(s): Secondary | ICD-10-CM

## 2019-05-24 DIAGNOSIS — B351 Tinea unguium: Secondary | ICD-10-CM | POA: Diagnosis not present

## 2019-05-24 NOTE — Progress Notes (Signed)
Subjective: Travis Pham is a 33 y.o. male patient seen today in office for follow up evaluation of nail fungus on Lamisil. Patient states that he is doing well with no adverse reaction. Also is going for laser nail treatments with improvements. Patient has no other pedal complaints at this time.   There are no problems to display for this patient.   Current Outpatient Medications on File Prior to Visit  Medication Sig Dispense Refill  . terbinafine (LAMISIL) 250 MG tablet Take 1 tablet (250 mg total) by mouth daily. 90 tablet 0  . clomiPHENE (CLOMID) 50 MG tablet Take 25 mg by mouth daily.  5  . diclofenac (VOLTAREN) 50 MG EC tablet Take 1 tablet (50 mg total) by mouth 2 (two) times daily. 15 tablet 0  . methocarbamol (ROBAXIN) 500 MG tablet Take 1 tablet (500 mg total) by mouth 2 (two) times daily. 20 tablet 0   No current facility-administered medications on file prior to visit.    No Known Allergies  Objective: Physical Exam  General: Well developed, nourished, no acute distress, awake, alert and oriented x 3  Vascular: Dorsalis pedis artery 2/4 bilateral, Posterior tibial artery 2/4 bilateral, skin temperature warm to warm proximal to distal bilateral lower extremities, no varicosities, pedal hair present bilateral.  Neurological: Gross sensation present via light touch bilateral.   Dermatological: Skin is warm, dry, and supple bilateral, Nails 1-10 are tender, short thick, and discolored with mild subungal debris and early clearance noted at proximal nail bed, right great toe most involved, no webspace macerations present bilateral, no open lesions present bilateral, no callus/corns/hyperkeratotic tissue present bilateral. No signs of infection bilateral.  Musculoskeletal: No symptomatic boney deformities noted bilateral. Muscular strength within normal limits without painon range of motion. No pain with calf compression bilateral.  Assessment and Plan:  Problem List Items  Addressed This Visit    None    Visit Diagnoses    Nail fungus    -  Primary   Toe pain, bilateral          -Examined patient -Cont with Lamisil; a new set of LFTs were ordered; will call patient to stop medication if abnormal  -Continue with Laser treatments as scheduled -Advised good hygiene habits and educated patient on proper foot care to prevent re-infection -Patient to return as scheduled for laser for follow up evaluation or sooner if symptoms worsen.  Landis Martins, DPM

## 2019-05-25 LAB — HEPATIC FUNCTION PANEL
AG Ratio: 2.1 (calc) (ref 1.0–2.5)
ALT: 43 U/L (ref 9–46)
AST: 27 U/L (ref 10–40)
Albumin: 4.5 g/dL (ref 3.6–5.1)
Alkaline phosphatase (APISO): 55 U/L (ref 36–130)
Bilirubin, Direct: 0.1 mg/dL (ref 0.0–0.2)
Globulin: 2.1 g/dL (calc) (ref 1.9–3.7)
Indirect Bilirubin: 0.3 mg/dL (calc) (ref 0.2–1.2)
Total Bilirubin: 0.4 mg/dL (ref 0.2–1.2)
Total Protein: 6.6 g/dL (ref 6.1–8.1)

## 2019-06-13 ENCOUNTER — Other Ambulatory Visit: Payer: Managed Care, Other (non HMO)

## 2019-07-11 ENCOUNTER — Other Ambulatory Visit: Payer: Self-pay

## 2019-07-11 ENCOUNTER — Ambulatory Visit: Payer: Self-pay | Admitting: *Deleted

## 2019-07-11 DIAGNOSIS — B351 Tinea unguium: Secondary | ICD-10-CM

## 2019-07-11 NOTE — Progress Notes (Signed)
Patient presents today for the 2nd laser treatment. Diagnosed with mycotic nail infection by Dr. Marylene Land. Toenail most affected is the hallux right. He is very happy with the appearance of his toenails. There is just some slight discoloration and thickening at the tip of the right hallux nail. The other nails have completely grown out and appear to be fungus free.  All other systems are negative.  Nails were filed thin. Laser therapy was administered to 1-5 toenails bilateral and patient tolerated the treatment well. All safety precautions were in place.   Patient is still taking oral terbinafine.  Follow up in 8 weeks for laser # 3. Advised patient he could cancel this appointment if the right hallux nail is completely clear.

## 2019-09-05 ENCOUNTER — Other Ambulatory Visit: Payer: Managed Care, Other (non HMO)

## 2019-11-06 ENCOUNTER — Emergency Department (HOSPITAL_COMMUNITY)
Admission: EM | Admit: 2019-11-06 | Discharge: 2019-11-06 | Disposition: A | Discharge status: Home / Self Care | Payer: Managed Care, Other (non HMO) | Attending: Emergency Medicine | Admitting: Emergency Medicine

## 2019-11-06 ENCOUNTER — Other Ambulatory Visit: Payer: Self-pay

## 2019-11-06 ENCOUNTER — Encounter (HOSPITAL_COMMUNITY): Payer: Self-pay | Admitting: Emergency Medicine

## 2019-11-06 DIAGNOSIS — Y999 Unspecified external cause status: Secondary | ICD-10-CM | POA: Diagnosis not present

## 2019-11-06 DIAGNOSIS — Z23 Encounter for immunization: Secondary | ICD-10-CM | POA: Insufficient documentation

## 2019-11-06 DIAGNOSIS — Y9344 Activity, trampolining: Secondary | ICD-10-CM | POA: Insufficient documentation

## 2019-11-06 DIAGNOSIS — S01112A Laceration without foreign body of left eyelid and periocular area, initial encounter: Secondary | ICD-10-CM | POA: Diagnosis present

## 2019-11-06 DIAGNOSIS — R030 Elevated blood-pressure reading, without diagnosis of hypertension: Secondary | ICD-10-CM | POA: Diagnosis not present

## 2019-11-06 DIAGNOSIS — Z79899 Other long term (current) drug therapy: Secondary | ICD-10-CM | POA: Insufficient documentation

## 2019-11-06 DIAGNOSIS — W01198A Fall on same level from slipping, tripping and stumbling with subsequent striking against other object, initial encounter: Secondary | ICD-10-CM | POA: Diagnosis not present

## 2019-11-06 DIAGNOSIS — Y929 Unspecified place or not applicable: Secondary | ICD-10-CM | POA: Diagnosis not present

## 2019-11-06 MED ORDER — TETANUS-DIPHTH-ACELL PERTUSSIS 5-2.5-18.5 LF-MCG/0.5 IM SUSP
0.5000 mL | Freq: Once | INTRAMUSCULAR | Status: AC
  Administered 2019-11-06: 0.5 mL via INTRAMUSCULAR
  Filled 2019-11-06: qty 0.5

## 2019-11-06 MED ORDER — LIDOCAINE-EPINEPHRINE 2 %-1:100000 IJ SOLN
20.0000 mL | Freq: Once | INTRAMUSCULAR | Status: AC
  Administered 2019-11-06: 20 mL via INTRADERMAL
  Filled 2019-11-06: qty 1

## 2019-11-06 NOTE — ED Provider Notes (Signed)
[facility]-EMERGENCY DEPT Provider Note   CSN: [id number] Arrival date & time: 11/06/19  1851     History Chief Complaint  Patient presents with  . Laceration    Travis Pham is a 34 y.o. male. Who presents emergency department chief complaint of left eyebrow laceration. Patient states that he was jumping on his trampoline with socks when he slipped and hit his head on the framework of the trampoline. He did not lose Consciousness. He is unsure of his last tetanus vaccination  HPI     History reviewed. No pertinent past medical history.  There are no problems to display for this patient.   History reviewed. No pertinent surgical history.     Family History  Problem Relation Age of Onset  . Cancer Mother     Social History   Tobacco Use  . Smoking status: Never Smoker  . Smokeless tobacco: Never Used  Substance Use Topics  . Alcohol use: No    Alcohol/week: 0.0 standard drinks  . Drug use: No    Home Medications Prior to Admission medications   Medication Sig Start Date End Date Taking? Authorizing Provider  clomiPHENE (CLOMID) 50 MG tablet Take 25 mg by mouth daily. 03/17/17   [provider]  diclofenac (VOLTAREN) 50 MG EC tablet Take 1 tablet (50 mg total) by mouth 2 (two) times daily. 06/15/17   Janne Napoleon, NP  methocarbamol (ROBAXIN) 500 MG tablet Take 1 tablet (500 mg total) by mouth 2 (two) times daily. 06/15/17   Janne Napoleon, NP  terbinafine (LAMISIL) 250 MG tablet Take 1 tablet (250 mg total) by mouth daily. 03/09/19   Asencion Islam, DPM    Allergies    Patient has no known allergies.  Review of Systems   Review of Systems  Constitutional: Negative for chills and fever.  Skin: Positive for wound.  Neurological: Negative for syncope and headaches.    Physical Exam Updated Vital Signs BP (!) 139/102 (BP Location: Left Arm)   Pulse 68   Temp 98.1 F (36.7 C) (Oral)   Resp 17   SpO2 100%   Physical  Exam Vitals and nursing note reviewed.  Constitutional:      General: He is not in acute distress.    Appearance: He is well-developed. He is not diaphoretic.  HENT:     Head: Normocephalic.     Comments: Laceration of the left eyebrow, normal facial muscle movement Eyes:     General: No scleral icterus.    Conjunctiva/sclera: Conjunctivae normal.  Cardiovascular:     Rate and Rhythm: Normal rate and regular rhythm.     Heart sounds: Normal heart sounds.  Pulmonary:     Effort: Pulmonary effort is normal. No respiratory distress.     Breath sounds: Normal breath sounds.  Abdominal:     Palpations: Abdomen is soft.     Tenderness: There is no abdominal tenderness.  Musculoskeletal:     Cervical back: Normal range of motion and neck supple.  Skin:    General: Skin is warm and dry.  Neurological:     Mental Status: He is alert.  Psychiatric:        Behavior: Behavior normal.     ED Results / Procedures / Treatments   Labs (all labs ordered are listed, but only abnormal results are displayed) Labs Reviewed - No data to display  EKG None  Radiology No results found.  Procedures .Marland KitchenLaceration Repair  Date/Time: 11/06/2019 9:43 PM Performed  by: Margarita Mail, PA-C Authorized by: Margarita Mail, PA-C   Consent:    Consent obtained:  Verbal   Consent given by:  Patient   Risks discussed:  Infection, need for additional repair, pain, poor cosmetic result and poor wound healing   Alternatives discussed:  No treatment and delayed treatment Universal protocol:    Procedure explained and questions answered to patient or proxy's satisfaction: yes     Relevant documents present and verified: yes     Test results available and properly labeled: yes     Imaging studies available: yes     Required blood products, implants, devices, and special equipment available: yes     Site/side marked: yes     Immediately prior to procedure, a time out was called: yes     Patient  identity confirmed:  Verbally with patient Anesthesia (see MAR for exact dosages):    Anesthesia method:  Local infiltration   Local anesthetic:  Lidocaine 1% WITH epi Laceration details:    Location:  Face   Face location:  L eyebrow   Length (cm):  3   Depth (mm):  10 Repair type:    Repair type:  Intermediate Pre-procedure details:    Preparation:  Patient was prepped and draped in usual sterile fashion Exploration:    Wound exploration: wound explored through full range of motion     Wound extent: foreign bodies/material     Wound extent: no muscle damage noted     Foreign bodies/material:  Plastic    Contaminated: yes   Treatment:    Area cleansed with:  Betadine   Amount of cleaning:  Extensive   Irrigation solution:  Sterile saline   Irrigation method:  Syringe   Visualized foreign bodies/material removed: yes   Skin repair:    Repair method:  Sutures   Suture size:  5-0   Suture material:  Prolene   Suture technique:  Simple interrupted   Number of sutures:  5 Approximation:    Approximation:  Close Post-procedure details:    Dressing:  Sterile dressing   Patient tolerance of procedure:  Tolerated well, no immediate complications   (including critical care time)  Medications Ordered in ED Medications  lidocaine-EPINEPHrine (XYLOCAINE W/EPI) 2 %-1:100000 (with pres) injection 20 mL (20 mLs Intradermal Given by Other 11/06/19 2055)  Tdap (BOOSTRIX) injection 0.5 mL (0.5 mLs Intramuscular Given 11/06/19 2055)    ED Course  I have reviewed the triage vital signs and the nursing notes.  Pertinent labs & imaging results that were available during my care of the patient were reviewed by me and considered in my medical decision making (see chart for details).    MDM Rules/Calculators/A&P                      Naphtali Zywicki is a 34 y.o. male who presents to ED for laceration of left eyebrow. Wound thoroughly cleaned in ED today. Wound explored and bottom of wound  seen in a bloodless field. Laceration repaired as dictated above. Patient counseled on home wound care. Follow up with PCP/urgent care or return to ER for suture removal in  5 days. Patient was urged to return to the Emergency Department for worsening pain, swelling, expanding erythema especially if it streaks away from the affected area, fever, or for any additional concerns. Patient verbalized understanding. All questions answered.  PATIENT INFORMED OF POSSIBILITY OF RETAINED FOREIGN MATERIAL EVEN AFTER CLEANSING AND DBRIDEMENT.  Final Clinical Impression(s) /  ED Diagnoses Final diagnoses:  Laceration of left eyebrow, initial encounter  Elevated blood pressure reading    Rx / DC Orders ED Discharge Orders    None       Arthor Captain, PA-C 11/06/19 2147    Pollyann Savoy, MD 11/06/19 2321

## 2019-11-06 NOTE — Discharge Instructions (Addendum)
WOUND CARE Please have your stitches/staples removed in 5-7 days or sooner if you have concerns. You may do this at any available urgent care or at your primary care doctor's office.  Keep area clean and dry for 24 hours. Do not remove bandage, if applied.  After 24 hours, remove bandage and wash wound gently with mild soap and warm water. Reapply a new bandage after cleaning wound, if directed.  Continue daily cleansing with soap and water until stitches/staples are removed.  Do not apply any ointments or creams to the wound while stitches/staples are in place, as this may cause delayed healing.  Seek medical careif you experience any of the following signs of infection: Swelling, redness, pus drainage, streaking, fever >101.0 F  Seek care if you experience excessive bleeding that does not stop after 15-20 minutes of constant, firm pressure.    

## 2019-11-06 NOTE — ED Triage Notes (Signed)
Pt has laceration to forehead from falling on trampoline and hitting head on railing. Denies pains.
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
## 2023-03-12 ENCOUNTER — Other Ambulatory Visit: Payer: Self-pay | Admitting: Urology

## 2023-03-12 DIAGNOSIS — D4011 Neoplasm of uncertain behavior of right testis: Secondary | ICD-10-CM

## 2023-03-16 ENCOUNTER — Ambulatory Visit
Admission: RE | Admit: 2023-03-16 | Discharge: 2023-03-16 | Disposition: A | Discharge status: Home / Self Care | Payer: Commercial Managed Care - PPO | Source: Ambulatory Visit | Attending: Urology | Admitting: Urology

## 2023-03-16 DIAGNOSIS — D4011 Neoplasm of uncertain behavior of right testis: Secondary | ICD-10-CM

## 2023-05-05 ENCOUNTER — Other Ambulatory Visit (HOSPITAL_COMMUNITY): Payer: Self-pay | Admitting: Urology

## 2023-05-05 DIAGNOSIS — C6211 Malignant neoplasm of descended right testis: Secondary | ICD-10-CM

## 2023-05-08 ENCOUNTER — Ambulatory Visit (HOSPITAL_COMMUNITY)
Admission: RE | Admit: 2023-05-08 | Discharge: 2023-05-08 | Disposition: A | Discharge status: Home / Self Care | Payer: Commercial Managed Care - PPO | Source: Ambulatory Visit | Attending: Urology | Admitting: Urology

## 2023-05-08 DIAGNOSIS — C6211 Malignant neoplasm of descended right testis: Secondary | ICD-10-CM | POA: Insufficient documentation

## 2023-05-08 MED ORDER — IOHEXOL 300 MG/ML  SOLN
100.0000 mL | Freq: Once | INTRAMUSCULAR | Status: AC | PRN
  Administered 2023-05-08: 100 mL via INTRAVENOUS

## 2023-05-14 DIAGNOSIS — C6291 Malignant neoplasm of right testis, unspecified whether descended or undescended: Secondary | ICD-10-CM | POA: Insufficient documentation
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
## 2023-05-14 NOTE — Assessment & Plan Note (Addendum)
Diagnosis: Right seminoma. pT2. 7 cm. LVI+. Posiive for rete testis invasion. Margins negative Treatment: Orchiectomy  He will let us know.  If elects to proceed then planning for chemotherapy teaching for carboplatin. Repeat tumor markers with AFP, hCG and LDH. Obtain CBC and CMP No evidence of metastases on CT chest abdomen pelvis. Recommend carboplatin AUC of 7 x 2 cycles Surveillance after adjuvant therapy including H&P, labs every 6 months for 2 years, and then annually.  CT chest, abdomen and pelvis annually. Surveillance without adjuvant therapy will be CT chest abdomen and pelvis every 4 months for first year, with tumor markers, H&P.

## 2023-05-15 ENCOUNTER — Inpatient Hospital Stay: Payer: Commercial Managed Care - PPO

## 2023-05-15 VITALS — BP 125/64 | HR 50 | Temp 97.5°F | Resp 20 | Wt 151.9 lb

## 2023-05-15 DIAGNOSIS — C6291 Malignant neoplasm of right testis, unspecified whether descended or undescended: Secondary | ICD-10-CM | POA: Insufficient documentation

## 2023-05-15 LAB — CMP (CANCER CENTER ONLY)
ALT: 55 U/L — ABNORMAL HIGH (ref 0–44)
AST: 34 U/L (ref 15–41)
Albumin: 4.9 g/dL (ref 3.5–5.0)
Alkaline Phosphatase: 46 U/L (ref 38–126)
Anion gap: 7 (ref 5–15)
BUN: 13 mg/dL (ref 6–20)
CO2: 30 mmol/L (ref 22–32)
Calcium: 9.9 mg/dL (ref 8.9–10.3)
Chloride: 104 mmol/L (ref 98–111)
Creatinine: 0.95 mg/dL (ref 0.61–1.24)
GFR, Estimated: >60 mL/min (ref >60)
Glucose, Bld: 82 mg/dL (ref 70–99)
Potassium: 4.1 mmol/L (ref 3.5–5.1)
Sodium: 141 mmol/L (ref 135–145)
Total Bilirubin: 0.4 mg/dL (ref <1.2)
Total Protein: 7.6 g/dL (ref 6.5–8.1)

## 2023-05-15 LAB — CBC WITH DIFFERENTIAL (CANCER CENTER ONLY)
Abs Immature Granulocytes: 0.01 10*3/uL (ref 0.00–0.07)
Basophils Absolute: 0 10*3/uL (ref 0.0–0.1)
Basophils Relative: 1 %
Eosinophils Absolute: 0.2 10*3/uL (ref 0.0–0.5)
Eosinophils Relative: 5 %
HCT: 43.7 % (ref 39.0–52.0)
Hemoglobin: 14.3 g/dL (ref 13.0–17.0)
Immature Granulocytes: 0 %
Lymphocytes Relative: 39 %
Lymphs Abs: 1.4 10*3/uL (ref 0.7–4.0)
MCH: 29.4 pg (ref 26.0–34.0)
MCHC: 32.7 g/dL (ref 30.0–36.0)
MCV: 89.7 fL (ref 80.0–100.0)
Monocytes Absolute: 0.3 10*3/uL (ref 0.1–1.0)
Monocytes Relative: 7 %
Neutro Abs: 1.8 10*3/uL (ref 1.7–7.7)
Neutrophils Relative %: 48 %
Platelet Count: 214 10*3/uL (ref 150–400)
RBC: 4.87 MIL/uL (ref 4.22–5.81)
RDW: 13.5 % (ref 11.5–15.5)
WBC Count: 3.7 10*3/uL — ABNORMAL LOW (ref 4.0–10.5)
nRBC: 0 % (ref 0.0–0.2)

## 2023-05-15 LAB — LACTATE DEHYDROGENASE: LDH: 143 U/L (ref 98–192)

## 2023-05-15 NOTE — Patient Instructions (Addendum)
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  It is a type of cancer from the testicle.  Surgery is the primary treatment which you had.  The relapse rate for stage I seminoma is higher based on size and other pathology findings.  In patients with large size of greater than 4 cm, there is higher risk for relapse reported of 24% in 1 study.  Another risk factor is seeing tumor invasion in rete testis around the testicle.  In patients with both large size and rete testis invasion, 5-year relapse rate is over 31%. Studies showed 2 cycles of single drug carboplatin reduce risk of relapse.  The 5-year rate of relapse is less than 5%.  If undergoing surveillance, we only consider chemotherapy if the cancer relapse. At that time, the treatment will be more intense with combination chemotherapy.  If undergoing surveillance, recommend imaging every 4 months for the first year, every 6 months for the second and third year.  History and physical with labs every 3 months in the first year.  Carboplatin is given by IV once every 3 weeks.  In this setting, total of 2 doses.  Potential side effects of carboplatin including hair loss, electrolyte abnormality, nausea, vomiting, abdominal pain, constipation, diarrhea, anemia, low platelets, low white blood cell count, increased risk of infection, abnormal liver function, fatigue, kidney injury, bleeding, hypersensitivity reaction, neuropathy, rare visual change and ototoxicity.  Patient need to watch for signs of infection, fever and report to ED immediately.  Severe infection resulting In sepsis, and death from it can occur.  After discussion, Travis Pham expresses understanding and would like to proceed.  We also discussed sperm banking. Before chemotherapy you may see a fertility specialist for consultation.  Please discuss with your wife and family and let us know your decision.

## 2023-05-16 LAB — AFP TUMOR MARKER: AFP, Serum, Tumor Marker: 4.4 ng/mL (ref 0.0–6.9)

## 2023-05-16 LAB — BETA HCG QUANT (REF LAB): hCG Quant: 3 m[IU]/mL (ref 0–3)

## 2023-05-18 ENCOUNTER — Telehealth: Payer: Self-pay | Admitting: *Deleted

## 2023-05-18 NOTE — Telephone Encounter (Signed)
Referral sent to [facility]. Faxed: [phone], LM on their voicemail with the referral.

## 2023-09-11 ENCOUNTER — Other Ambulatory Visit: Payer: Self-pay

## 2023-09-11 ENCOUNTER — Telehealth: Payer: Self-pay

## 2023-09-11 DIAGNOSIS — C6291 Malignant neoplasm of right testis, unspecified whether descended or undescended: Secondary | ICD-10-CM

## 2023-09-11 NOTE — Telephone Encounter (Signed)
 I informed Travis Pham of his added lab appointment. Travis Pham was except able to the time selected.

## 2023-09-11 NOTE — Progress Notes (Signed)
 [facility] CONSULT NOTE  Patient Care Team: Sun, Vyvyan, MD as PCP - General (Family Medicine)  ASSESSMENT & PLAN:  Travis Pham is a 38 y.o.male with history of hypogonadism being [facility] for right testicular cancer.  Patient presented with enlarged right testicular mass.  On 03/16/2023 ultrasound showed right testicle replaced by lobulated heterogeneous solid vascularized mass.  He underwent right orchiectomy on 04/22/2023.  Pathology showed pT2 seminoma, positive for LVI and with testis invasion.  Size was 7 cm.  Diagnosis: pT2cN0M0 Stage IB seminoma. Treatment: right Orchiectomy 03/24/2023 preop AFP was 3.2 hCG 9, and LDH 243. Postop was normal in 05/2023.  CT showed 2 mm triangular nodule along the right minor fissure on image 68/10 is favored an intrapulmonary lymph node.  After discussion, we elected on surveillance.  Given the reported 2 mm triangular nodule along the right minor fissure will obtain CT chest with abdomen and pelvis.  NCCN guideline:   Cancer, testis, seminoma, right (HCC) AFP, hCG and LDH. CBC and CMP Surveillance without adjuvant therapy will be CT chest abdomen and pelvis every 4 months for first year, with tumor markers, H&P.   Orders Placed This Encounter  Procedures   CT CHEST ABDOMEN PELVIS W CONTRAST    Standing Status:   Future    Expected Date:   09/18/2023    Expiration Date:   09/10/2024    If indicated for the ordered procedure, I authorize the administration of contrast media per Radiology protocol:   Yes    Does the patient have a contrast media/X-ray dye allergy?:   No    Preferred imaging location?:   [facility]    If indicated for the ordered procedure, I authorize the administration of oral contrast media per Radiology protocol:   Yes   CBC with Differential (Cancer Center Only)    Standing Status:   Future    Expiration Date:   09/10/2024   CMP (Cancer Center only)    Standing Status:    Future    Expiration Date:   09/10/2024   AFP tumor marker    Standing Status:   Future    Expiration Date:   09/10/2024   Beta hCG quant (ref lab)    Standing Status:   Future    Expiration Date:   09/10/2024   Lactate dehydrogenase    Standing Status:   Future    Expiration Date:   09/10/2024   All questions were answered. The patient knows to call the clinic with any problems, questions or concerns. No barriers to learning was detected.  Lowanda Ruddy, MD 4/14/20258:33 AM  CHIEF COMPLAINTS/PURPOSE OF CONSULTATION:  Seminoma  HISTORY OF PRESENTING ILLNESS:  Overall feeling well. No new symptoms.  Oncology history: Travis Pham (goes by [place]) was found to have right testicular cancer at age 82 with history of hypogonadism  Patient noticed the lump for about a year on the right groin area and started to feeling maybe bigger and felt other lump.  On 03/16/2023 ultrasound showed right testicle replaced by lobulated heterogeneous solid vascularized mass.    He underwent right orchiectomy on 04/22/2023.  Pathology showed pT2 seminoma, positive for LVI and with testis invasion.  Size was 7 cm.  03/24/2023 preop AFP was 3.2 hCG 9, and LDH 243.  05/08/23 CT CAP: Prior right orchiectomy without evidence of metastatic disease in the chest, abdomen or pelvis.  05/15/23 Postop TM with normal AFP, HCG and LDH.   Elected  on AS after consultation.   ALLERGIES:  has no known allergies.  MEDICATIONS:  Current Outpatient Medications  Medication Sig Dispense Refill   ASHWAGANDHA PO Take 1 capsule by mouth daily at 2 PM.     BLACK CURRANT SEED OIL PO Take 4 drops by mouth daily at 2 PM.     OIL OF OREGANO PO Take 4 drops by mouth daily at 2 PM.     UNABLE TO FIND Take 1 capsule by mouth daily. Sea moss     No current facility-administered medications for this visit.    REVIEW OF SYSTEMS:   All relevant systems were reviewed with the patient and are negative.  PHYSICAL EXAMINATION: ECOG  PERFORMANCE STATUS: 0 - Asymptomatic  Vitals:   09/14/23 0818  BP: 110/67  Pulse: (!) 48  Resp: 16  Temp: 97.9 F (36.6 C)  SpO2: 100%    Filed Weights   09/14/23 0818  Weight: 138 lb 9.6 oz (62.9 kg)     GENERAL: alert, no distress and comfortable SKIN: skin color is normal LYMPH:  no palpable lymphadenopathy in the cervical, axillary regions LUNGS: Effort normal, no respiratory distress.  Clear to auscultation bilaterally HEART: regular rate & rhythm and no lower extremity edema ABDOMEN: soft, non-tender and nondistended Musculoskeletal: no point tenderness NEURO: no focal motor/sensory deficits  LABORATORY DATA:  I have reviewed the data as listed Lab Results  Component Value Date   WBC 4.3 09/14/2023   HGB 14.2 09/14/2023   HCT 43.0 09/14/2023   MCV 88.5 09/14/2023   PLT 219 09/14/2023   Recent Labs    05/15/23 1149  NA 141  K 4.1  CL 104  CO2 30  GLUCOSE 82  BUN 13  CREATININE 0.95  CALCIUM 9.9  GFRNONAA >60  PROT 7.6  ALBUMIN 4.9  AST 34  ALT 55*  ALKPHOS 46  BILITOT 0.4    RADIOGRAPHIC STUDIES: I have personally reviewed the radiological images as listed and agreed with the findings in the report.

## 2023-09-14 ENCOUNTER — Inpatient Hospital Stay: Payer: Commercial Managed Care - PPO

## 2023-09-14 ENCOUNTER — Inpatient Hospital Stay

## 2023-09-14 VITALS — BP 110/67 | HR 48 | Temp 97.9°F | Resp 16 | Wt 138.6 lb

## 2023-09-14 DIAGNOSIS — Z9079 Acquired absence of other genital organ(s): Secondary | ICD-10-CM | POA: Diagnosis not present

## 2023-09-14 DIAGNOSIS — C6291 Malignant neoplasm of right testis, unspecified whether descended or undescended: Secondary | ICD-10-CM

## 2023-09-14 DIAGNOSIS — Z8547 Personal history of malignant neoplasm of testis: Secondary | ICD-10-CM | POA: Insufficient documentation

## 2023-09-14 LAB — CBC WITH DIFFERENTIAL (CANCER CENTER ONLY)
Abs Immature Granulocytes: 0 10*3/uL (ref 0.00–0.07)
Basophils Absolute: 0 10*3/uL (ref 0.0–0.1)
Basophils Relative: 1 %
Eosinophils Absolute: 0.2 10*3/uL (ref 0.0–0.5)
Eosinophils Relative: 5 %
HCT: 43 % (ref 39.0–52.0)
Hemoglobin: 14.2 g/dL (ref 13.0–17.0)
Immature Granulocytes: 0 %
Lymphocytes Relative: 47 %
Lymphs Abs: 2 10*3/uL (ref 0.7–4.0)
MCH: 29.2 pg (ref 26.0–34.0)
MCHC: 33 g/dL (ref 30.0–36.0)
MCV: 88.5 fL (ref 80.0–100.0)
Monocytes Absolute: 0.4 10*3/uL (ref 0.1–1.0)
Monocytes Relative: 8 %
Neutro Abs: 1.7 10*3/uL (ref 1.7–7.7)
Neutrophils Relative %: 39 %
Platelet Count: 219 10*3/uL (ref 150–400)
RBC: 4.86 MIL/uL (ref 4.22–5.81)
RDW: 13.2 % (ref 11.5–15.5)
WBC Count: 4.3 10*3/uL (ref 4.0–10.5)
nRBC: 0 % (ref 0.0–0.2)

## 2023-09-14 LAB — CMP (CANCER CENTER ONLY)
ALT: 26 U/L (ref 0–44)
AST: 23 U/L (ref 15–41)
Albumin: 4.8 g/dL (ref 3.5–5.0)
Alkaline Phosphatase: 69 U/L (ref 38–126)
Anion gap: 6 (ref 5–15)
BUN: 9 mg/dL (ref 6–20)
CO2: 29 mmol/L (ref 22–32)
Calcium: 9.2 mg/dL (ref 8.9–10.3)
Chloride: 106 mmol/L (ref 98–111)
Creatinine: 1.09 mg/dL (ref 0.61–1.24)
GFR, Estimated: >60 mL/min (ref >60)
Glucose, Bld: 90 mg/dL (ref 70–99)
Potassium: 4.4 mmol/L (ref 3.5–5.1)
Sodium: 141 mmol/L (ref 135–145)
Total Bilirubin: 0.3 mg/dL (ref 0.0–1.2)
Total Protein: 7.2 g/dL (ref 6.5–8.1)

## 2023-09-14 LAB — LACTATE DEHYDROGENASE: LDH: 148 U/L (ref 98–192)

## 2023-09-14 NOTE — Assessment & Plan Note (Signed)
 AFP, hCG and LDH. CBC and CMP Surveillance without adjuvant therapy will be CT chest abdomen and pelvis every 4 months for first year, with tumor markers, H&P.

## 2023-09-15 LAB — BETA HCG QUANT (REF LAB): hCG Quant: 9 m[IU]/mL — ABNORMAL HIGH (ref 0–3)

## 2023-09-15 LAB — AFP TUMOR MARKER: AFP, Serum, Tumor Marker: 3.9 ng/mL (ref 0.0–6.9)

## 2023-09-18 ENCOUNTER — Ambulatory Visit (HOSPITAL_COMMUNITY)
Admission: RE | Admit: 2023-09-18 | Discharge: 2023-09-18 | Disposition: A | Discharge status: Home / Self Care | Source: Ambulatory Visit

## 2023-09-18 ENCOUNTER — Encounter (HOSPITAL_COMMUNITY): Payer: Self-pay

## 2023-09-18 DIAGNOSIS — C6291 Malignant neoplasm of right testis, unspecified whether descended or undescended: Secondary | ICD-10-CM | POA: Diagnosis present

## 2023-09-18 MED ORDER — IOHEXOL 300 MG/ML  SOLN
100.0000 mL | Freq: Once | INTRAMUSCULAR | Status: AC | PRN
  Administered 2023-09-18: 100 mL via INTRAVENOUS

## 2023-09-22 ENCOUNTER — Telehealth: Payer: Self-pay

## 2023-09-22 NOTE — Telephone Encounter (Signed)
 Travis Pham accepted all appointments scheduled for June.

## 2023-09-22 NOTE — Telephone Encounter (Signed)
-----   Message from Lowanda Ruddy sent at 09/22/2023  9:32 AM EDT ----- Please let him know CT did not show concerning findings or cancer. Great news. He HCG marker is slightly above normal. Please ask if he is using marijuana. If so please refrain next time because can cause falsely elevated tumor marker.   Recommend short term follow up lab in about 8 weeks with CBC, CMP, LDH, AFP and HCG tumor markers and follow up with me a day after. Thank you.

## 2023-09-22 NOTE — Telephone Encounter (Signed)
 TC to inform of the below message by Dr. Alita Irwin. Pt reports that he does not use marijuana. Informed that scheduler should be calling him soon to set up f/u appointments. Pt verbalizes understanding.

## 2023-11-16 ENCOUNTER — Inpatient Hospital Stay

## 2023-11-16 DIAGNOSIS — C6291 Malignant neoplasm of right testis, unspecified whether descended or undescended: Secondary | ICD-10-CM

## 2023-11-16 DIAGNOSIS — Z8547 Personal history of malignant neoplasm of testis: Secondary | ICD-10-CM | POA: Diagnosis present

## 2023-11-16 LAB — CMP (CANCER CENTER ONLY)
ALT: 20 U/L (ref 0–44)
AST: 20 U/L (ref 15–41)
Albumin: 4.5 g/dL (ref 3.5–5.0)
Alkaline Phosphatase: 67 U/L (ref 38–126)
Anion gap: 6 (ref 5–15)
BUN: 6 mg/dL (ref 6–20)
CO2: 29 mmol/L (ref 22–32)
Calcium: 9.2 mg/dL (ref 8.9–10.3)
Chloride: 107 mmol/L (ref 98–111)
Creatinine: 0.89 mg/dL (ref 0.61–1.24)
GFR, Estimated: >60 mL/min (ref >60)
Glucose, Bld: 94 mg/dL (ref 70–99)
Potassium: 4.8 mmol/L (ref 3.5–5.1)
Sodium: 142 mmol/L (ref 135–145)
Total Bilirubin: 0.3 mg/dL (ref 0.0–1.2)
Total Protein: 6.5 g/dL (ref 6.5–8.1)

## 2023-11-16 LAB — CBC WITH DIFFERENTIAL (CANCER CENTER ONLY)
Abs Immature Granulocytes: 0 10*3/uL (ref 0.00–0.07)
Basophils Absolute: 0 10*3/uL (ref 0.0–0.1)
Basophils Relative: 1 %
Eosinophils Absolute: 0.3 10*3/uL (ref 0.0–0.5)
Eosinophils Relative: 5 %
HCT: 41.6 % (ref 39.0–52.0)
Hemoglobin: 13.8 g/dL (ref 13.0–17.0)
Immature Granulocytes: 0 %
Lymphocytes Relative: 48 %
Lymphs Abs: 2.2 10*3/uL (ref 0.7–4.0)
MCH: 29.5 pg (ref 26.0–34.0)
MCHC: 33.2 g/dL (ref 30.0–36.0)
MCV: 88.9 fL (ref 80.0–100.0)
Monocytes Absolute: 0.4 10*3/uL (ref 0.1–1.0)
Monocytes Relative: 8 %
Neutro Abs: 1.8 10*3/uL (ref 1.7–7.7)
Neutrophils Relative %: 38 %
Platelet Count: 206 10*3/uL (ref 150–400)
RBC: 4.68 MIL/uL (ref 4.22–5.81)
RDW: 13.4 % (ref 11.5–15.5)
WBC Count: 4.6 10*3/uL (ref 4.0–10.5)
nRBC: 0 % (ref 0.0–0.2)

## 2023-11-16 LAB — LACTATE DEHYDROGENASE: LDH: 134 U/L (ref 98–192)

## 2023-11-17 ENCOUNTER — Ambulatory Visit: Payer: Self-pay

## 2023-11-17 LAB — AFP TUMOR MARKER: AFP, Serum, Tumor Marker: 3.5 ng/mL (ref 0.0–6.9)

## 2023-11-17 LAB — BETA HCG QUANT (REF LAB): hCG Quant: 12 m[IU]/mL — ABNORMAL HIGH (ref 0–3)

## 2023-11-18 NOTE — Assessment & Plan Note (Signed)
 Risking HCG. Will obtain imaging. Surveillance without adjuvant therapy will be CT chest abdomen and pelvis every 4 months for first year, with tumor markers, H&P.

## 2023-11-18 NOTE — Progress Notes (Unsigned)
 [facility] OFFICE PROGRESS NOTE  Patient Care Team: Sun, Vyvyan, MD as PCP - General (Family Medicine)  Travis Pham is a 38 y.o.male with history of hypogonadism being seen [facility] for right testicular cancer.  Patient presented with enlarged right testicular mass.  On 03/16/2023 ultrasound showed right testicle replaced by lobulated heterogeneous solid vascularized mass.  He underwent right orchiectomy on 04/22/2023.  Pathology showed pT2 seminoma, positive for LVI and with testis invasion.  Size was 7 cm. Elected on surveillance after discussion.   Diagnosis: pT2cN0M0 Stage IB seminoma. Treatment: right Orchiectomy 03/24/2023 preop AFP was 3.2 hCG 9, and LDH 243. Postop was normal in 05/2023.  Elevated HCG tumor marker. Last CT was negative. Will repeat CT next months. Clinically feeling well. Assessment & Plan Cancer, testis, seminoma, right (HCC) Risking HCG. Will obtain imaging next months. Surveillance without adjuvant therapy will be CT chest abdomen and pelvis every 4 months for first year, with tumor markers, H&P.   Orders Placed This Encounter  Procedures   CT CHEST ABDOMEN PELVIS W CONTRAST    Standing Status:   Future    Expected Date:   12/19/2023    Expiration Date:   11/18/2024    If indicated for the ordered procedure, I authorize the administration of contrast media per Radiology protocol:   Yes    Does the patient have a contrast media/X-ray dye allergy?:   No    Preferred imaging location?:   [facility]    If indicated for the ordered procedure, I authorize the administration of oral contrast media per Radiology protocol:   Yes   CBC with Differential (Cancer Center Only)    Standing Status:   Future    Expiration Date:   11/17/2024   Comprehensive metabolic panel with GFR    Standing Status:   Future    Expiration Date:   11/17/2024   AFP tumor marker    Standing Status:   Future    Expiration Date:   11/17/2024   Beta hCG  quant (ref lab)    Standing Status:   Future    Expiration Date:   11/17/2024   Lactate dehydrogenase    Standing Status:   Future    Expiration Date:   11/17/2024     Lowanda Ruddy, MD  INTERVAL HISTORY: Patient returns for follow-up. No back pain, bone pain, chest pain. Appetite is good. No unexpected weight loss.  No stomach pain, nausea, vomiting, coughing, short of breath, headache, trouble urinating, hematuria.  No lump or mass.  PHYSICAL EXAMINATION: ECOG PERFORMANCE STATUS: 0 - Asymptomatic  Vitals:   11/19/23 1415  BP: 108/75  Pulse: (!) 50  Resp: 17  Temp: 97.9 F (36.6 C)  SpO2: 100%   Filed Weights   11/19/23 1415  Weight: 134 lb 9.6 oz (61.1 kg)    GENERAL: alert, no distress and comfortable SKIN: skin color normal and no jaundice on exposed skin EYES: normal, sclera clear OROPHARYNX: no exudate  NECK: No palpable mass LYMPH:  no palpable cervical, axillary lymphadenopathy  LUNGS: clear to auscultation and percussion with normal breathing effort HEART: regular rate & rhythm  ABDOMEN: abdomen soft, non-tender and nondistended. Musculoskeletal: no edema NEURO: no focal motor/sensory deficits  Relevant data reviewed during this visit included labs.

## 2023-11-19 ENCOUNTER — Inpatient Hospital Stay

## 2023-11-19 VITALS — BP 108/75 | HR 50 | Temp 97.9°F | Resp 17 | Ht 66.34 in | Wt 134.6 lb

## 2023-11-19 DIAGNOSIS — Z8547 Personal history of malignant neoplasm of testis: Secondary | ICD-10-CM | POA: Diagnosis not present

## 2023-11-19 DIAGNOSIS — C6291 Malignant neoplasm of right testis, unspecified whether descended or undescended: Secondary | ICD-10-CM

## 2023-12-18 ENCOUNTER — Ambulatory Visit (HOSPITAL_COMMUNITY)
Admission: RE | Admit: 2023-12-18 | Discharge: 2023-12-18 | Disposition: A | Discharge status: Home / Self Care | Source: Ambulatory Visit

## 2023-12-18 DIAGNOSIS — C6291 Malignant neoplasm of right testis, unspecified whether descended or undescended: Secondary | ICD-10-CM | POA: Insufficient documentation

## 2023-12-18 MED ORDER — IOHEXOL 300 MG/ML  SOLN
100.0000 mL | Freq: Once | INTRAMUSCULAR | Status: AC | PRN
  Administered 2023-12-18: 100 mL via INTRAVENOUS

## 2023-12-21 ENCOUNTER — Other Ambulatory Visit: Payer: Self-pay

## 2023-12-21 DIAGNOSIS — R591 Generalized enlarged lymph nodes: Secondary | ICD-10-CM

## 2023-12-21 NOTE — Progress Notes (Signed)
 Called and spoke to patient regarding results.  Newly found external iliac lymph node enlargement on the right concerning for metastases.  hCG mildly elevated.  Contacted Dr. Jenna. Will obtain biopsy if able.  CT biopsy ordered.  Will follow-up a few days after biopsy.  He understands.

## 2023-12-22 NOTE — Progress Notes (Unsigned)
 Travis Sieving, MD  [name] PROCEDURE / BIOPSY REVIEW Date: 12/22/23  Requested Biopsy site: R ext iliac LAN Reason for request: r/o met v lymphoma Imaging review: Best seen on CT 12/18/23  Decision: Approved Imaging modality to perform: Ultrasound Schedule with: Moderate Sedation Schedule for: Any VIR  Additional comments:   Please contact me with questions, concerns, or if issue pertaining to this request arise.  Dayne Pham Johann, MD [facility] Radiology       Previous Messages    ----- Message ----- From: [name] Sent: 12/21/2023   3:25 PM EDT To: Rosella CROME Baldwin; Ir Procedure Requests Subject: CT ABDOMINAL MASS BIOPSY                      Procedure :CT ABDOMINAL MASS BIOPSY  Reason :Newly enlarged right external iliac lymph nodes needs diagnosis,Dx: Lymphadenopathy [R59.1 (ICD-10-CM)   History :CT CHEST ABDOMEN PELVIS W CONTRAST,CT CHEST ABDOMEN PELVIS W CONTRAST  Provider:Chang, Pauletta BROCKS, MD  Provider contact ; [phone]

## 2023-12-25 ENCOUNTER — Ambulatory Visit: Payer: Self-pay

## 2023-12-25 ENCOUNTER — Telehealth: Payer: Self-pay

## 2023-12-25 NOTE — Telephone Encounter (Signed)
 Scheduled appointment with the patient per 7/25 result note.

## 2024-01-02 ENCOUNTER — Other Ambulatory Visit: Payer: Self-pay | Admitting: Radiology

## 2024-01-02 DIAGNOSIS — R591 Generalized enlarged lymph nodes: Secondary | ICD-10-CM

## 2024-01-03 NOTE — H&P (Signed)
 Chief Complaint: Testicular cancer, newly enlarged right external iliac lymph nodes; referred for image guided iliac lymph node biopsy  Referring Provider(s): Chang,R  Supervising Physician: Hughes Simmonds  Patient Status: [facility] - Out-pt  History of Present Illness: Travis Pham is a 38 y.o. male with PMH sig for right testicular seminoma, s/p right orchiectomy on 04/22/23. Due to rising HCG pt underwent F/U CT C/A/P on 12/20/23 which revealed : 1. Newly enlarged right external iliac lymph nodes difficult to distinguish from adjacent vasculature, suspicious for nodal metastatic disease. 2. No evidence of metastatic disease in the chest. 3. Symmetric wall thickening of a nondistended urinary bladder. Correlate with urinalysis to exclude cystitis  He presents today for image guided right external iliac LN biopsy for further evaluation.  *** Patient is Full Code  No past medical history on file.  No past surgical history on file.  Allergies: Patient has no known allergies.  Medications: Prior to Admission medications   Medication Sig Start Date End Date Taking? Authorizing Provider  ASHWAGANDHA PO Take 1 capsule by mouth daily at 2 PM.    [provider]  BLACK CURRANT SEED OIL PO Take 4 drops by mouth daily at 2 PM.    [provider]  OIL OF OREGANO PO Take 4 drops by mouth daily at 2 PM.    [provider]  UNABLE TO FIND Take 1 capsule by mouth daily. Sea moss    [provider]     Family History  Problem Relation Age of Onset   Cancer Mother     Social History   Socioeconomic History   Marital status: Married    Spouse name: Not on file   Number of children: Not on file   Years of education: college   Highest education level: Not on file  Occupational History   Occupation: Material Financial trader: [employer]  Tobacco Use   Smoking status: Never   Smokeless tobacco: Never  Substance and Sexual Activity    Alcohol use: No    Alcohol/week: 0.0 standard drinks of alcohol   Drug use: No   Sexual activity: Yes    Birth control/protection: None    Comment: with mongoamous girlfriend  Other Topics Concern   Not on file  Social History Narrative   Pt is [place] . Has lived in [place] since ~2015. Currently lives with his girlfriend and son. Has one 34 year old son, who is not biologically his, but he has consistently taken care of since birth. He has full custody of him.       Diet: Likes chicken, vegetables, pizza, and soup. Drinks coffee in the morning and water the rest of th e day.       Exercise: Goes to the gym daily. Does both weight training and aerobic exercise.   Social Drivers of Corporate investment banker Strain: Not on file  Food Insecurity: Not on file  Transportation Needs: Not on file  Physical Activity: Not on file  Stress: Not on file  Social Connections: Unknown (10/05/2021)   Received from [facility]   Social Network    Social Network: Not on file       Review of Systems  Vital Signs:   Advance Care Plan: no documents on file   Physical Exam  Imaging: CT CHEST ABDOMEN PELVIS W CONTRAST Result Date: 12/20/2023 CLINICAL DATA:  Rising HCG, history of testicular cancer. * Tracking Code: BO * EXAM: CT  CHEST, ABDOMEN, AND PELVIS WITH CONTRAST TECHNIQUE: Multidetector CT imaging of the chest, abdomen and pelvis was performed following the standard protocol during bolus administration of intravenous contrast. RADIATION DOSE REDUCTION: This exam was performed according to the departmental dose-optimization program which includes automated exposure control, adjustment of the mA and/or kV according to patient size and/or use of iterative reconstruction technique. CONTRAST:  OMNIPAQUE  IOHEXOL  300 MG/ML  SOLN COMPARISON:  Multiple priors including most recent CT September 18, 2023 FINDINGS: CT CHEST FINDINGS Cardiovascular: Normal caliber thoracic aorta. Normal size  heart. No significant pericardial effusion/thickening. Mediastinum/Nodes: No suspicious thyroid nodule. No pathologically enlarged mediastinal, hilar or axillary lymph nodes. The esophagus is grossly unremarkable. Lungs/Pleura: No suspicious pulmonary nodules or masses. Scattered atelectasis/scarring. No pleural effusion. No pneumothorax. Musculoskeletal: No aggressive lytic or blastic lesion of bone. CT ABDOMEN PELVIS FINDINGS Hepatobiliary: No suspicious hepatic lesion. Gallbladder is unremarkable. No biliary ductal dilation. Pancreas: No pancreatic ductal dilation or evidence of acute inflammation. Spleen: No splenomegaly. Adrenals/Urinary Tract: No suspicious adrenal nodule/mass. No hydronephrosis. Kidneys demonstrate symmetric enhancement. Symmetric wall thickening of a nondistended urinary bladder. Stomach/Bowel: Stomach is within normal limits. No evidence of bowel wall thickening, distention, or inflammatory changes. Vascular/Lymphatic: Newly enlarged right external iliac lymph nodes difficult to distinguish from adjacent vasculature for instance measuring 14 mm in short axis on image 106/2 and approximately 14 mm in short axis on image 104/2. No paraaortic retroperitoneal adenopathy. Normal caliber abdominal aorta. Reproductive: Prostate glands unremarkable. Prior right orchiectomy. Postsurgical change along the anterior pelvic wall and right inguinal canal. Other: No significant abdominopelvic free fluid. Musculoskeletal: Stable sclerotic focus in the left femoral head on image 77/4 favored a benign bone island. IMPRESSION: 1. Newly enlarged right external iliac lymph nodes difficult to distinguish from adjacent vasculature, suspicious for nodal metastatic disease. 2. No evidence of metastatic disease in the chest. 3. Symmetric wall thickening of a nondistended urinary bladder. Correlate with urinalysis to exclude cystitis. Electronically Signed   By: Reyes Holder M.D.   On: 12/20/2023 11:05     Labs:  CBC: Recent Labs    05/15/23 1149 09/14/23 0802 11/16/23 0806  WBC 3.7* 4.3 4.6  HGB 14.3 14.2 13.8  HCT 43.7 43.0 41.6  PLT 214 219 206    COAGS: No results for input(s): INR, APTT in the last 8760 hours.  BMP: Recent Labs    05/15/23 1149 09/14/23 0802 11/16/23 0806  NA 141 141 142  K 4.1 4.4 4.8  CL 104 106 107  CO2 30 29 29   GLUCOSE 82 90 94  BUN 13 9 6   CALCIUM 9.9 9.2 9.2  CREATININE 0.95 1.09 0.89  GFRNONAA >60 >60 >60    LIVER FUNCTION TESTS: Recent Labs    05/15/23 1149 09/14/23 0802 11/16/23 0806  BILITOT 0.4 0.3 0.3  AST 34 23 20  ALT 55* 26 20  ALKPHOS 46 69 67  PROT 7.6 7.2 6.5  ALBUMIN 4.9 4.8 4.5    TUMOR MARKERS: No results for input(s): AFPTM, CEA, CA199, CHROMGRNA in the last 8760 hours.  Assessment and Plan: 38 y.o. male with PMH sig for right testicular seminoma, s/p right orchiectomy on 04/22/23. Due to rising HCG pt underwent F/U CT C/A/P on 12/20/23 which revealed : 1. Newly enlarged right external iliac lymph nodes difficult to distinguish from adjacent vasculature, suspicious for nodal metastatic disease. 2. No evidence of metastatic disease in the chest. 3. Symmetric wall thickening of a nondistended urinary bladder. Correlate with urinalysis to exclude cystitis  He presents today for image guided right external iliac LN biopsy for further evaluation.Risks and benefits of procedure was discussed with the patient including, but not limited to bleeding, infection, damage to adjacent structures or low yield requiring additional tests.  All of the questions were answered and there is agreement to proceed.  Consent signed and in chart.    Thank you for allowing our service to participate in Travis Pham 's care.  Electronically Signed: D. Franky Rakers, PA-C   01/03/2024, 5:20 PM      I spent a total of  20 minutes   in face to face in clinical consultation, greater than 50% of which was  counseling/coordinating care for image guided right external iliac lymph node biopsy

## 2024-01-04 ENCOUNTER — Telehealth: Payer: Self-pay

## 2024-01-04 NOTE — Telephone Encounter (Signed)
 Patient is scheduled for a biopsy tomorrow and called because he has questions. Returned his call but had to leave a message. Also left the phone number to IR in case he had questions for their department about the procedure. Andrea CHRISTELLA Plunk, RN

## 2024-01-05 ENCOUNTER — Encounter (HOSPITAL_COMMUNITY): Payer: Self-pay

## 2024-01-05 ENCOUNTER — Other Ambulatory Visit: Payer: Self-pay

## 2024-01-05 ENCOUNTER — Ambulatory Visit (HOSPITAL_COMMUNITY)
Admission: RE | Admit: 2024-01-05 | Discharge: 2024-01-05 | Disposition: A | Discharge status: Home / Self Care | Source: Ambulatory Visit

## 2024-01-05 ENCOUNTER — Ambulatory Visit (HOSPITAL_COMMUNITY)

## 2024-01-05 DIAGNOSIS — R591 Generalized enlarged lymph nodes: Secondary | ICD-10-CM

## 2024-01-05 DIAGNOSIS — C775 Secondary and unspecified malignant neoplasm of intrapelvic lymph nodes: Secondary | ICD-10-CM | POA: Diagnosis not present

## 2024-01-05 DIAGNOSIS — C6290 Malignant neoplasm of unspecified testis, unspecified whether descended or undescended: Secondary | ICD-10-CM | POA: Diagnosis present

## 2024-01-05 LAB — CBC WITH DIFFERENTIAL/PLATELET
Abs Immature Granulocytes: 0.01 K/uL (ref 0.00–0.07)
Basophils Absolute: 0 K/uL (ref 0.0–0.1)
Basophils Relative: 1 %
Eosinophils Absolute: 0.2 K/uL (ref 0.0–0.5)
Eosinophils Relative: 4 %
HCT: 45.8 % (ref 39.0–52.0)
Hemoglobin: 14.4 g/dL (ref 13.0–17.0)
Immature Granulocytes: 0 %
Lymphocytes Relative: 43 %
Lymphs Abs: 1.7 K/uL (ref 0.7–4.0)
MCH: 28.8 pg (ref 26.0–34.0)
MCHC: 31.4 g/dL (ref 30.0–36.0)
MCV: 91.6 fL (ref 80.0–100.0)
Monocytes Absolute: 0.3 K/uL (ref 0.1–1.0)
Monocytes Relative: 9 %
Neutro Abs: 1.7 K/uL (ref 1.7–7.7)
Neutrophils Relative %: 43 %
Platelets: 223 K/uL (ref 150–400)
RBC: 5 MIL/uL (ref 4.22–5.81)
RDW: 13.4 % (ref 11.5–15.5)
WBC: 4 K/uL (ref 4.0–10.5)
nRBC: 0 % (ref 0.0–0.2)

## 2024-01-05 LAB — BASIC METABOLIC PANEL WITH GFR
Anion gap: 8 (ref 5–15)
BUN: 8 mg/dL (ref 6–20)
CO2: 27 mmol/L (ref 22–32)
Calcium: 9 mg/dL (ref 8.9–10.3)
Chloride: 106 mmol/L (ref 98–111)
Creatinine, Ser: 0.77 mg/dL (ref 0.61–1.24)
GFR, Estimated: >60 mL/min (ref >60)
Glucose, Bld: 78 mg/dL (ref 70–99)
Potassium: 4.1 mmol/L (ref 3.5–5.1)
Sodium: 141 mmol/L (ref 135–145)

## 2024-01-05 LAB — PROTIME-INR
INR: 1 (ref 0.8–1.2)
Prothrombin Time: 14 s (ref 11.4–15.2)

## 2024-01-05 MED ORDER — FENTANYL CITRATE (PF) 100 MCG/2ML IJ SOLN
INTRAMUSCULAR | Status: AC | PRN
  Administered 2024-01-05: 50 ug via INTRAVENOUS

## 2024-01-05 MED ORDER — MIDAZOLAM HCL 2 MG/2ML IJ SOLN
INTRAMUSCULAR | Status: AC | PRN
Start: 2024-01-05 — End: 2024-01-05
  Administered 2024-01-05: 1 mg via INTRAVENOUS

## 2024-01-05 MED ORDER — SODIUM CHLORIDE 0.9 % IV SOLN: INTRAVENOUS | Status: DC

## 2024-01-05 MED ORDER — FENTANYL CITRATE (PF) 100 MCG/2ML IJ SOLN
INTRAMUSCULAR | Status: AC
  Filled 2024-01-05: qty 2

## 2024-01-05 MED ORDER — LIDOCAINE-EPINEPHRINE 1 %-1:100000 IJ SOLN
INTRAMUSCULAR | Status: AC
  Filled 2024-01-05: qty 1

## 2024-01-05 MED ORDER — MIDAZOLAM HCL 2 MG/2ML IJ SOLN
INTRAMUSCULAR | Status: AC
  Filled 2024-01-05: qty 2

## 2024-01-05 NOTE — Sedation Documentation (Signed)
 RN Tyronne Blann pulled 2 mg Versed  and 100 mcg Fentanyl  in Ir med room. Pt. Received 1 mg Versed  and 50 mcg Fentanyl  throughout the procedure. RN Arzu Mcgaughey wasted 1 mg Versed  and  50 mcg Fentanyl  with Research officer, trade union.

## 2024-01-05 NOTE — Progress Notes (Signed)
 Return time 1233

## 2024-01-05 NOTE — Procedures (Signed)
 Vascular and Interventional Radiology Procedure Note  Patient: Travis Pham DOB: [birth year] Medical Record Number: [id number] Note Date/Time: 01/05/24 11:55 AM   Performing Physician: Thom Hall, MD Assistant(s): None  Diagnosis: Testicular CA   Procedure: RIGHT GROIN LYMPH NODE BIOPSY  Anesthesia: Conscious Sedation Complications: None Estimated Blood Loss: Minimal Specimens: Sent for Pathology  Findings:  Successful Ultrasound-guided biopsy of a R groin LN. A total of 3 samples were obtained. Hemostasis of the tract was achieved using Manual Pressure.  Plan: Bed rest for 1 hours.  See detailed procedure note with images in PACS. The patient tolerated the procedure well without incident or complication and was returned to Recovery in stable condition.    Thom Hall, MD [facility] Inter[facility] Radiology [facility]ists [facility] Radiology   Pager. [phone] Clinic. [phone]

## 2024-01-07 LAB — SURGICAL PATHOLOGY

## 2024-01-11 ENCOUNTER — Ambulatory Visit

## 2024-01-11 ENCOUNTER — Other Ambulatory Visit

## 2024-01-13 NOTE — Progress Notes (Signed)
 [facility] OFFICE PROGRESS NOTE  Patient Care Team: Sun, Vyvyan, MD as PCP - General (Family Medicine)  Travis Pham is a 38 y.o.male with history of hypogonadism being seen at Medical Oncology Clinic for right testicular cancer.  Patient presented with enlarged right testicular mass.  On 03/16/2023 ultrasound showed right testicle replaced by lobulated heterogeneous solid vascularized mass.  He underwent right orchiectomy on 04/22/2023.  Pathology showed pT2 seminoma, positive for LVI and with rete testis invasion.  Size was 7 cm. Discussed risk can be as high as over 30% on AS and with chemotherapy likely less than 6%. After considering options, Travis Pham elected on surveillance after discussion.  Elevated HCG tumor marker. First CT was negative. HCG increased to 12. Repeat CT showed enlarged right external iliac lymph nodes . Clinically feeling well. Biopsy showed seminoma.   Initial Diagnosis: pT2cN0M0 Stage IB seminoma. Current diagnosis: stage IIA pN1 <3 cm in transaxial long axis (1.4 x 2.5 cm, 1.4 x 2.0 cm, 1 x 1.5 cm) Treatment: right Orchiectomy 03/24/2023 preop AFP was 3.2 hCG 9, and LDH 243. Postop was normal in 05/2023.   Discussed chemotherapy versus radiation.  Given stage IIA in size less than 3 cm in longest axis, radiation can be an option.  We discussed systemic treatment will require BEP x 3 cycles, potential toxicity and side effects.  We discussed potential side effects of each treatment.  We discussed potential irreversible hearing damage, renal failure, neuropathy with cisplatin, pulmonary toxicity with bleomycin, profound cytopenia, require transfusion, potential hospitalization, sepsis, along with other side effects.  We discussed treatment delay is common along with hospitalization.  We discussed consultation with radiation as well. Assessment & Plan Cancer, testis, seminoma, right [facility]) Discussed BEP Referral to radiation oncology Patient will let us  know once he  makes his decision If he would like to proceed with radiation, we will follow-up for long-term surveillance after his completion After treatment, planning on follow-up in 3 months with labs and imaging  Orders Placed This Encounter  Procedures   Ambulatory referral to Radiation Oncology    Referral Priority:   Routine    Referral Type:   Consultation    Referral Reason:   Specialty Services Required    Requested Specialty:   Radiation Oncology    Number of Visits Requested:   1     [name], MD  INTERVAL HISTORY: Patient returns for follow-up.  Overall feeling well.  No back pain or any other symptoms no neurologic symptoms.  Oncology History: Patient noticed the lump for about a year on the right groin area and started to feeling maybe bigger and felt other lump.  On 03/16/2023 ultrasound showed right testicle replaced by lobulated heterogeneous solid vascularized mass.     He underwent right orchiectomy on 04/22/2023.  Pathology showed pT2 seminoma, positive for LVI and with testis invasion.  Size was 7 cm.   03/24/2023 preop AFP was 3.2 hCG 9, and LDH 243.   05/08/23 CT CAP: Prior right orchiectomy without evidence of metastatic disease in the chest, abdomen or pelvis.   05/15/23 Postop TM with normal AFP, HCG and LDH.    Travis Pham elected on AS after consultation.  09/14/23 HCG: 9 09/18/23 CT CAP: NED 11/16/23 HCG: 12 12/18/23 CT CAP: Newly enlarged right external iliac lymph nodes difficult to distinguish from adjacent vasculature for instance measuring 14 mm in short axis on image 106/2 and approximately 14 mm in short axis on image 104/2.  01/05/24 LYMPH NODE  BIOPSY: One lymph node, positive for metastatic seminoma (0/1). The specimen shows clusters of epithelioid cells within lymphoid tissue.  The patients history of seminoma is noted. Immunohistochemical stains  show the cells are positive for CD117, OCT3/4 and D2-40. The findings  are in keeping with metastatic seminoma  in a lymph node.    PHYSICAL EXAMINATION: ECOG PERFORMANCE STATUS: 0 - Asymptomatic  Vitals:   01/14/24 1033  BP: 110/72  Pulse: (!) 57  Resp: 18  Temp: 97.7 F (36.5 C)  SpO2: 100%   Filed Weights   01/14/24 1033  Weight: 136 lb 11.2 oz (62 kg)   GENERAL: alert, no distress and comfortable SKIN: skin color normal EYES: normal, sclera clear OROPHARYNX: no exudate  NECK: No palpable mass LYMPH:  no palpable cervical lymphadenopathy  LUNGS: clear to auscultation and percussion with normal breathing effort HEART: regular rate & rhythm  ABDOMEN: abdomen soft, non-tender and nondistended. Musculoskeletal: no edema NEURO: no focal motor/sensory deficits  Relevant data reviewed during this visit included labs

## 2024-01-14 ENCOUNTER — Inpatient Hospital Stay

## 2024-01-14 VITALS — BP 110/72 | HR 57 | Temp 97.7°F | Resp 18 | Wt 136.7 lb

## 2024-01-14 DIAGNOSIS — Z8547 Personal history of malignant neoplasm of testis: Secondary | ICD-10-CM | POA: Insufficient documentation

## 2024-01-14 DIAGNOSIS — C6291 Malignant neoplasm of right testis, unspecified whether descended or undescended: Secondary | ICD-10-CM | POA: Diagnosis not present

## 2024-01-14 NOTE — Patient Instructions (Addendum)
 Chemotherapy will be 3 cycles. Each cycle is 3 weeks. Week 1 is 5 days a week. Week 2 and 3 are once a week. Delay can occur due to many reasons. Will refer to radiation oncology for consultation.

## 2024-01-14 NOTE — Assessment & Plan Note (Addendum)
 Discussed BEP Referral to radiation oncology Patient will let us  know once he makes his decision If he would like to proceed with radiation, we will follow-up for long-term surveillance after his completion After treatment, planning on follow-up in 3 months with labs and imaging

## 2024-01-18 ENCOUNTER — Telehealth: Payer: Self-pay | Admitting: Radiation Oncology

## 2024-01-18 ENCOUNTER — Other Ambulatory Visit

## 2024-01-18 ENCOUNTER — Ambulatory Visit

## 2024-01-18 NOTE — Telephone Encounter (Signed)
 Left message with RadOnc appointment available sooner and gave option for patient to call back to reschedule upcoming appointment.

## 2024-01-25 ENCOUNTER — Other Ambulatory Visit

## 2024-01-25 ENCOUNTER — Ambulatory Visit

## 2024-01-28 NOTE — Progress Notes (Signed)
 Location of Tumor / Histology: Stage 2a Seminoma   Travis Pham presented as referral from Dr. Pauletta Chihuahua ([facility])  Biopsies    Past/Anticipated interventions by urology, if any: NA  Past/Anticipated interventions by medical oncology, if any:  Dr. Pauletta Chihuahua   Weight changes, if any:  No  Bowel/Bladder complaints, if any:  No  Nausea/Vomiting, if any:  No  Pain issues, if any:  0/10  SAFETY ISSUES: Prior radiation?  No Pacemaker/ICD? No Possible current pregnancy? Male Is the patient on methotrexate? NA  Current Complaints / other details:  None   30 minutes spent total, including time for meaningful use questions, reviewing medication, as well as spent in face-to-face time in nurse evaluation with the patient.

## 2024-02-02 ENCOUNTER — Ambulatory Visit
Admission: RE | Admit: 2024-02-02 | Discharge: 2024-02-02 | Disposition: A | Discharge status: Home / Self Care | Source: Ambulatory Visit | Attending: Radiation Oncology | Admitting: Radiation Oncology

## 2024-02-02 ENCOUNTER — Encounter: Payer: Self-pay | Admitting: Radiation Oncology

## 2024-02-02 VITALS — BP 114/69 | HR 65 | Temp 97.5°F | Resp 18 | Ht 66.34 in | Wt 140.6 lb

## 2024-02-02 DIAGNOSIS — Z809 Family history of malignant neoplasm, unspecified: Secondary | ICD-10-CM | POA: Diagnosis not present

## 2024-02-02 DIAGNOSIS — R7989 Other specified abnormal findings of blood chemistry: Secondary | ICD-10-CM | POA: Insufficient documentation

## 2024-02-02 DIAGNOSIS — C6291 Malignant neoplasm of right testis, unspecified whether descended or undescended: Secondary | ICD-10-CM | POA: Diagnosis present

## 2024-02-02 DIAGNOSIS — R3911 Hesitancy of micturition: Secondary | ICD-10-CM | POA: Insufficient documentation

## 2024-02-02 HISTORY — DX: Malignant (primary) neoplasm, unspecified: C80.1

## 2024-02-02 NOTE — Progress Notes (Signed)
 Radiation Oncology         [phone] ________________________________  Initial outpatient Consultation  Name: Travis Pham MRN: [id number]  Date of Service: 02/02/2024 DOB: [birth year]  CC:Sun, Vyvyan, MD  Sun, Vyvyan, MD   REFERRING PHYSICIAN: Sun, Vyvyan, MD  DIAGNOSIS: 38 y.o. man with new right external iliac nodal metastasis from stage pT2 right testicular seminoma s/p orchiectomy 04/2023.    ICD-10-CM   1. Cancer, testis, seminoma, right (HCC)  C62.91       HISTORY OF PRESENT ILLNESS: Travis Pham is a 38 y.o. male seen at the request of Dr. Tina. He has a history of infertility and low testosterone/hypogonadism. More recently, he presented back to Dr. Carolee on 03/10/23 with right-sided testicular enlargement. Physical exam confirmed a nodular right testicle. 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 He proceeded with right orchiectomy on 04/22/23 under Dr. Carolee. Pathology from the procedure revealed a 7 cm seminoma, positive for rete testis involvement and lymphovascular invasion.   He underwent a staging CT C/A/P on 05/08/23 showing no evidence of metastatic disease. He was also referred to Dr. Tina on 05/15/23. After discussion of surveillance versus adjuvant treatment, the patient elected to proceed with surveillance. A repeat CT C/A/P on 09/18/23 remained negative.  Since initial evaluation by Dr. Tina, his hCG level has risen, most recently to 12 on 11/16/23. He underwent a surveillance CT C/A/P on 12/18/23, which revealed newly-enlarged right external iliac lymph nodes. Accordingly, he proceeded to biopsy of a right groin lymph node on 01/05/24. Pathology unfortunately confirmed metastatic seminoma.  PREVIOUS RADIATION THERAPY: No  PAST MEDICAL HISTORY:  Past Medical History:  Diagnosis Date   Cancer (HCC) 05/04/23    Seminoma      PAST SURGICAL HISTORY: Past Surgical History:  Procedure Laterality Date   FRACTURE SURGERY  N/A   Fifth metacarpal   HERNIA REPAIR  N/A   On my my chart    FAMILY HISTORY:  Family History  Problem Relation Age of Onset   Cancer Mother     SOCIAL HISTORY:  Social History   Socioeconomic History   Marital status: Married    Spouse name: Not on file   Number of children: Not on file   Years of education: college   Highest education level: Not on file  Occupational History   Occupation: Material Financial trader: [employer]  Tobacco Use   Smoking status: Never   Smokeless tobacco: Never  Substance and Sexual Activity   Alcohol use: No    Alcohol/week: 0.0 standard drinks of alcohol   Drug use: No   Sexual activity: Yes    Birth control/protection: None    Comment: with mongoamous girlfriend  Other Topics Concern   Not on file  Social History Narrative   Pt is [place] . Has lived in [place] since ~2015. Currently lives with his girlfriend and son. Has one 55 year old son, who is not biologically his, but he has consistently taken care of since birth. He has full custody of him.       Diet: Likes chicken, vegetables, pizza, and soup. Drinks coffee in the morning and water the rest of th e day.       Exercise: Goes to the gym daily. Does both weight training and aerobic exercise.   Social Drivers of Corporate investment banker Strain:  Not on file  Food Insecurity: Not on file  Transportation Needs: Not on file  Physical Activity: Not on file  Stress: Not on file  Social Connections: Unknown (10/05/2021)   [facility]   Social Network    Social Network: Not on file  Intimate Partner Violence: Unknown (09/02/2021)   Received from [facility]   HITS    Physically Hurt: Not on file    Insult or Talk Down To: Not on file    Threaten Physical Harm: Not on file    Scream or Curse: Not on file    ALLERGIES: Patient has no  allergy information on record.  MEDICATIONS:  Current Outpatient Medications  Medication Sig Dispense Refill   ASHWAGANDHA PO Take 1 capsule by mouth daily at 2 PM.     BLACK CURRANT SEED OIL PO Take 4 drops by mouth daily at 2 PM.     OIL OF OREGANO PO Take 4 drops by mouth daily at 2 PM.     UNABLE TO FIND Take 1 capsule by mouth daily. Sea moss     No current facility-administered medications for this encounter.    REVIEW OF SYSTEMS:  On review of systems, the patient reports that he is doing well overall. He denies any chest pain, shortness of breath, cough, fevers, chills, night sweats, unintended weight changes. He denies any bowel or bladder disturbances, and denies abdominal pain, nausea or vomiting. He denies any new musculoskeletal or joint aches or pains.*** A complete review of systems is obtained and is otherwise negative.    PHYSICAL EXAM:  Wt Readings from Last 3 Encounters:  01/14/24 136 lb 11.2 oz (62 kg)  11/19/23 134 lb 9.6 oz (61.1 kg)  09/14/23 138 lb 9.6 oz (62.9 kg)   Temp Readings from Last 3 Encounters:  01/14/24 97.7 F (36.5 C) (Temporal)  01/05/24 97.7 F (36.5 C) (Oral)  11/19/23 97.9 F (36.6 C) (Temporal)   BP Readings from Last 3 Encounters:  01/14/24 110/72  01/05/24 103/65  11/19/23 108/75   Pulse Readings from Last 3 Encounters:  01/14/24 (!) 57  01/05/24 (!) 47  11/19/23 (!) 50    /10  In general this is a well appearing *** man in no acute distress. He's alert and oriented x4 and appropriate throughout the examination. Cardiopulmonary assessment is negative for acute distress and he exhibits normal effort.     KPS = ***  100 - Normal; no complaints; no evidence of disease. 90   - Able to carry on normal activity; minor signs or symptoms of disease. 80   - Normal activity with effort; some signs or symptoms of disease. 8   - Cares for self; unable to carry on normal activity or to do active work. 60   - Requires occasional  assistance, but is able to care for most of his personal needs. 50   - Requires considerable assistance and frequent medical care. 40   - Disabled; requires special care and assistance. 30   - Severely disabled; hospital admission is indicated although death not imminent. 20   - Very sick; hospital admission necessary; active supportive treatment necessary. 10   - Moribund; fatal processes progressing rapidly. 0     - Dead  Karnofsky DA, Abelmann WH, Craver LS and Burchenal [facility] [phone]) The use of the nitrogen mustards in the palliative treatment of carcinoma: with particular reference to bronchogenic carcinoma Cancer 1 634-56  LABORATORY DATA:  Lab Results  Component Value  Date   WBC 4.0 01/05/2024   HGB 14.4 01/05/2024   HCT 45.8 01/05/2024   MCV 91.6 01/05/2024   PLT 223 01/05/2024   Lab Results  Component Value Date   NA 141 01/05/2024   K 4.1 01/05/2024   CL 106 01/05/2024   CO2 27 01/05/2024   Lab Results  Component Value Date   ALT 20 11/16/2023   AST 20 11/16/2023   ALKPHOS 67 11/16/2023   BILITOT 0.3 11/16/2023     RADIOGRAPHY: US  CORE BIOPSY (LYMPH NODES) Result Date: 01/05/2024 INDICATION: Newly enlarged right external iliac lymph nodes needs diagnosis EXAM: ULTRASOUND-GUIDED RIGHT GROIN LYMPH NODE BIOPSY COMPARISON:  CT CAP, 12/18/2023 MEDICATIONS: None ANESTHESIA/SEDATION: Moderate (conscious) sedation was employed during this procedure. A total of Versed  1 mg and Fentanyl  50 mcg was administered intravenously. Moderate Sedation Time: 15 minutes. The patient's level of consciousness and vital signs were monitored continuously by radiology nursing throughout the procedure under my direct supervision. COMPLICATIONS: None immediate. TECHNIQUE: Informed written consent was obtained from the patient after a discussion of the risks, benefits and alternatives to treatment. Questions regarding the procedure were encouraged and answered. Initial ultrasound scanning demonstrated an  enlarged RIGHT groin lymph node, along the external iliac chain. An ultrasound image was saved for documentation purposes. The procedure was planned. A timeout was performed prior to the initiation of the procedure. The operative was prepped and draped in the usual sterile fashion, and a sterile drape was applied covering the operative field. A timeout was performed prior to the initiation of the procedure. Local anesthesia was provided with 1% lidocaine  with epinephrine . Under direct ultrasound guidance, an 18 gauge core needle device was utilized to obtain to obtain 3 core needle biopsies of the RIGHT inguinal lymph. The samples were placed in saline and submitted to pathology. The needle was removed and superficial hemostasis was achieved with manual compression. Post procedure scan was negative for significant hematoma. A dressing was applied. The patient tolerated the procedure well without immediate postprocedural complication. IMPRESSION: Successful ultrasound guided biopsy of a RIGHT groin lymph node. Thom Hall, MD [facility] Radiology Electronically Signed   By: Thom Hall M.D.   On: 01/05/2024 17:07      IMPRESSION/PLAN: 1. 38 y.o. new right external iliac nodal metastasis from stage pT2 right testicular seminoma s/p orchiectomy 04/2023.  Today, we talked to the patient and family about the findings and workup thus far. We discussed the natural history of testicular cancer and general treatment, highlighting the role of radiotherapy in the management of metastatic disease.*** We discussed the available radiation techniques, and focused on the details and logistics of delivery. We reviewed the anticipated acute and late sequelae associated with radiation in this setting. The patient was encouraged to ask questions that were answered to his satisfaction.  At the end of our discussion, the patient ***   I personally spent *** minutes in this  encounter including chart review, reviewing radiological studies, meeting face-to-face with the patient, entering orders and completing documentation.    Sabra MICAEL Rusk, PA-C    Donnice Barge, MD  [facility]  Radiation Oncology Direct Dial: [phone]  Fax: [phone] [email or web address]  Skype  LinkedIn   This document serves as a record of services personally performed by Donnice Barge, MD and Sabra Rusk, PA-C. It was created on their behalf by Izetta Neither, a trained medical scribe. The creation of this record is based on the scribe's personal observations and the  provider's statements to them. This document has been checked and approved by the attending provider.

## 2024-02-04 ENCOUNTER — Telehealth: Payer: Self-pay

## 2024-02-04 NOTE — Telephone Encounter (Signed)
 Spoke to patient, he has decided with his wife to undergo radiation.  Advised him to start sooner as able.  I will let radiation oncology know as well.  After that, we will continue follow-up long-term surveillance.

## 2024-02-12 ENCOUNTER — Ambulatory Visit
Admission: RE | Admit: 2024-02-12 | Discharge: 2024-02-12 | Disposition: A | Discharge status: Home / Self Care | Source: Ambulatory Visit | Attending: Radiation Oncology | Admitting: Radiation Oncology

## 2024-02-12 ENCOUNTER — Other Ambulatory Visit: Payer: Self-pay | Admitting: Urology

## 2024-02-12 DIAGNOSIS — Z9079 Acquired absence of other genital organ(s): Secondary | ICD-10-CM | POA: Diagnosis not present

## 2024-02-12 DIAGNOSIS — Z510 Encounter for antineoplastic radiation therapy: Secondary | ICD-10-CM | POA: Diagnosis not present

## 2024-02-12 DIAGNOSIS — C775 Secondary and unspecified malignant neoplasm of intrapelvic lymph nodes: Secondary | ICD-10-CM | POA: Insufficient documentation

## 2024-02-12 DIAGNOSIS — C6291 Malignant neoplasm of right testis, unspecified whether descended or undescended: Secondary | ICD-10-CM | POA: Insufficient documentation

## 2024-02-12 MED ORDER — ONDANSETRON HCL 8 MG PO TABS: 8.0000 mg | ORAL_TABLET | Freq: Three times a day (TID) | ORAL | 0 refills | Status: AC | PRN

## 2024-02-14 NOTE — Progress Notes (Signed)
  Radiation Oncology         ([phone]) [phone] ________________________________  Name: Azai Gaffin MRN: [id number]  Date: 02/12/2024  DOB: [birth year]  SIMULATION AND TREATMENT PLANNING NOTE    ICD-10-CM   1. Cancer, testis, seminoma, right (HCC)  C62.91       DIAGNOSIS:  38 y.o. man with new right external iliac nodal metastasis (stage IIB seminoma) s/p orchiectomy 04/2023 for stage pT2 right testicular seminoma .  NARRATIVE:  The patient was brought to the CT Simulation planning suite.  Identity was confirmed.  All relevant records and images related to the planned course of therapy were reviewed.  The patient freely provided informed written consent to proceed with treatment after reviewing the details related to the planned course of therapy. The consent form was witnessed and verified by the simulation staff.  Then, the patient was set-up in a stable reproducible  supine position for radiation therapy.  A clamshell was placed to shield his remaining testicle.  CT images were obtained.  Surface markings were placed.  The CT images were loaded into the planning software.  Notably, the patient has 12 thoracic vertebral bones and only 4 lumbar vertebral bones.  Then the target and avoidance structures were contoured.  Treatment planning then occurred.  The radiation prescription was entered and confirmed.  Then, I designed and supervised the construction of a total of at least 5 medically necessary complex treatment devices including body positioner, and MLC apertures to shield the kidneys.  I have requested : 3D Simulation  I have requested a DVH of the following structures: left kidney, right kidney, bowel, spinal cord, targets and others.    PLAN:  The patient will receive 21.6 Gy in 12 fractions then boost the positive nodes to 30.6 Gy with 5 more fractions of 1.8 Gy.  ________________________________  Donnice FELIX Patrcia, M.D.

## 2024-02-18 DIAGNOSIS — C6291 Malignant neoplasm of right testis, unspecified whether descended or undescended: Secondary | ICD-10-CM | POA: Diagnosis not present

## 2024-02-22 ENCOUNTER — Telehealth: Payer: Self-pay | Admitting: *Deleted

## 2024-02-22 ENCOUNTER — Other Ambulatory Visit: Payer: Self-pay

## 2024-02-22 ENCOUNTER — Ambulatory Visit
Admission: RE | Admit: 2024-02-22 | Discharge: 2024-02-22 | Disposition: A | Discharge status: Home / Self Care | Source: Ambulatory Visit | Attending: Radiation Oncology | Admitting: Radiation Oncology

## 2024-02-22 DIAGNOSIS — C6291 Malignant neoplasm of right testis, unspecified whether descended or undescended: Secondary | ICD-10-CM | POA: Diagnosis not present

## 2024-02-22 LAB — RAD ONC ARIA SESSION SUMMARY
Course Elapsed Days: 0
Plan Fractions Treated to Date: 1
Plan Prescribed Dose Per Fraction: 1.8 Gy
Plan Total Fractions Prescribed: 12
Plan Total Prescribed Dose: 21.6 Gy
Reference Point Dosage Given to Date: 1.8 Gy
Reference Point Session Dosage Given: 1.8 Gy
Session Number: 1

## 2024-02-22 NOTE — Telephone Encounter (Signed)
 Dwaine Capron, [phone] (home) called reporting he was in this morning for radiation and has FMLA forms for intermittent leave from work.  Advised hours are 9:00 am to 5:00 pm, Mon - Fri.  I'll .

## 2024-02-23 ENCOUNTER — Ambulatory Visit
Admission: RE | Admit: 2024-02-23 | Discharge: 2024-02-23 | Disposition: A | Discharge status: Home / Self Care | Source: Ambulatory Visit | Attending: Radiation Oncology

## 2024-02-23 ENCOUNTER — Other Ambulatory Visit: Payer: Self-pay

## 2024-02-23 DIAGNOSIS — C6291 Malignant neoplasm of right testis, unspecified whether descended or undescended: Secondary | ICD-10-CM | POA: Diagnosis not present

## 2024-02-23 LAB — RAD ONC ARIA SESSION SUMMARY
Course Elapsed Days: 1
Plan Fractions Treated to Date: 2
Plan Prescribed Dose Per Fraction: 1.8 Gy
Plan Total Fractions Prescribed: 12
Plan Total Prescribed Dose: 21.6 Gy
Reference Point Dosage Given to Date: 3.6 Gy
Reference Point Session Dosage Given: 1.8 Gy
Session Number: 2

## 2024-02-24 ENCOUNTER — Ambulatory Visit
Admission: RE | Admit: 2024-02-24 | Discharge: 2024-02-24 | Disposition: A | Discharge status: Home / Self Care | Source: Ambulatory Visit | Attending: Radiation Oncology | Admitting: Radiation Oncology

## 2024-02-24 ENCOUNTER — Other Ambulatory Visit: Payer: Self-pay

## 2024-02-24 DIAGNOSIS — C6291 Malignant neoplasm of right testis, unspecified whether descended or undescended: Secondary | ICD-10-CM | POA: Diagnosis not present

## 2024-02-24 LAB — RAD ONC ARIA SESSION SUMMARY
Course Elapsed Days: 2
Plan Fractions Treated to Date: 3
Plan Prescribed Dose Per Fraction: 1.8 Gy
Plan Total Fractions Prescribed: 12
Plan Total Prescribed Dose: 21.6 Gy
Reference Point Dosage Given to Date: 5.4 Gy
Reference Point Session Dosage Given: 1.8 Gy
Session Number: 3

## 2024-02-25 ENCOUNTER — Ambulatory Visit
Admission: RE | Admit: 2024-02-25 | Discharge: 2024-02-25 | Disposition: A | Discharge status: Home / Self Care | Source: Ambulatory Visit | Attending: Radiation Oncology | Admitting: Radiation Oncology

## 2024-02-25 ENCOUNTER — Other Ambulatory Visit: Payer: Self-pay

## 2024-02-25 DIAGNOSIS — C6291 Malignant neoplasm of right testis, unspecified whether descended or undescended: Secondary | ICD-10-CM | POA: Diagnosis not present

## 2024-02-25 LAB — RAD ONC ARIA SESSION SUMMARY
Course Elapsed Days: 3
Plan Fractions Treated to Date: 4
Plan Prescribed Dose Per Fraction: 1.8 Gy
Plan Total Fractions Prescribed: 12
Plan Total Prescribed Dose: 21.6 Gy
Reference Point Dosage Given to Date: 7.2 Gy
Reference Point Session Dosage Given: 1.8 Gy
Session Number: 4

## 2024-02-26 ENCOUNTER — Ambulatory Visit
Admission: RE | Admit: 2024-02-26 | Discharge: 2024-02-26 | Disposition: A | Discharge status: Home / Self Care | Source: Ambulatory Visit | Attending: Radiation Oncology | Admitting: Radiation Oncology

## 2024-02-26 ENCOUNTER — Other Ambulatory Visit: Payer: Self-pay

## 2024-02-26 DIAGNOSIS — C6291 Malignant neoplasm of right testis, unspecified whether descended or undescended: Secondary | ICD-10-CM | POA: Diagnosis not present

## 2024-02-26 LAB — RAD ONC ARIA SESSION SUMMARY
Course Elapsed Days: 4
Plan Fractions Treated to Date: 5
Plan Prescribed Dose Per Fraction: 1.8 Gy
Plan Total Fractions Prescribed: 12
Plan Total Prescribed Dose: 21.6 Gy
Reference Point Dosage Given to Date: 9 Gy
Reference Point Session Dosage Given: 1.8 Gy
Session Number: 5

## 2024-02-29 ENCOUNTER — Ambulatory Visit
Admission: RE | Admit: 2024-02-29 | Discharge: 2024-02-29 | Disposition: A | Source: Ambulatory Visit | Attending: Radiation Oncology

## 2024-02-29 ENCOUNTER — Other Ambulatory Visit: Payer: Self-pay

## 2024-02-29 DIAGNOSIS — C6291 Malignant neoplasm of right testis, unspecified whether descended or undescended: Secondary | ICD-10-CM | POA: Diagnosis not present

## 2024-02-29 LAB — RAD ONC ARIA SESSION SUMMARY
Course Elapsed Days: 7
Plan Fractions Treated to Date: 6
Plan Prescribed Dose Per Fraction: 1.8 Gy
Plan Total Fractions Prescribed: 12
Plan Total Prescribed Dose: 21.6 Gy
Reference Point Dosage Given to Date: 10.8 Gy
Reference Point Session Dosage Given: 1.8 Gy
Session Number: 6

## 2024-03-01 ENCOUNTER — Other Ambulatory Visit: Payer: Self-pay

## 2024-03-01 ENCOUNTER — Ambulatory Visit
Admission: RE | Admit: 2024-03-01 | Discharge: 2024-03-01 | Disposition: A | Source: Ambulatory Visit | Attending: Radiation Oncology

## 2024-03-01 DIAGNOSIS — C6291 Malignant neoplasm of right testis, unspecified whether descended or undescended: Secondary | ICD-10-CM | POA: Diagnosis not present

## 2024-03-01 LAB — RAD ONC ARIA SESSION SUMMARY
Course Elapsed Days: 8
Plan Fractions Treated to Date: 7
Plan Prescribed Dose Per Fraction: 1.8 Gy
Plan Total Fractions Prescribed: 12
Plan Total Prescribed Dose: 21.6 Gy
Reference Point Dosage Given to Date: 12.6 Gy
Reference Point Session Dosage Given: 1.8 Gy
Session Number: 7

## 2024-03-02 ENCOUNTER — Other Ambulatory Visit: Payer: Self-pay

## 2024-03-02 ENCOUNTER — Ambulatory Visit
Admission: RE | Admit: 2024-03-02 | Discharge: 2024-03-02 | Disposition: A | Discharge status: Home / Self Care | Source: Ambulatory Visit | Attending: Radiation Oncology | Admitting: Radiation Oncology

## 2024-03-02 DIAGNOSIS — Z510 Encounter for antineoplastic radiation therapy: Secondary | ICD-10-CM | POA: Insufficient documentation

## 2024-03-02 DIAGNOSIS — C775 Secondary and unspecified malignant neoplasm of intrapelvic lymph nodes: Secondary | ICD-10-CM | POA: Diagnosis not present

## 2024-03-02 DIAGNOSIS — Z9079 Acquired absence of other genital organ(s): Secondary | ICD-10-CM | POA: Diagnosis not present

## 2024-03-02 DIAGNOSIS — C6291 Malignant neoplasm of right testis, unspecified whether descended or undescended: Secondary | ICD-10-CM | POA: Insufficient documentation

## 2024-03-02 LAB — RAD ONC ARIA SESSION SUMMARY
Course Elapsed Days: 9
Plan Fractions Treated to Date: 8
Plan Prescribed Dose Per Fraction: 1.8 Gy
Plan Total Fractions Prescribed: 12
Plan Total Prescribed Dose: 21.6 Gy
Reference Point Dosage Given to Date: 14.4 Gy
Reference Point Session Dosage Given: 1.8 Gy
Session Number: 8

## 2024-03-03 ENCOUNTER — Ambulatory Visit
Admission: RE | Admit: 2024-03-03 | Discharge: 2024-03-03 | Disposition: A | Source: Ambulatory Visit | Attending: Radiation Oncology

## 2024-03-03 ENCOUNTER — Other Ambulatory Visit: Payer: Self-pay

## 2024-03-03 DIAGNOSIS — C6291 Malignant neoplasm of right testis, unspecified whether descended or undescended: Secondary | ICD-10-CM | POA: Diagnosis not present

## 2024-03-03 LAB — RAD ONC ARIA SESSION SUMMARY
Course Elapsed Days: 10
Plan Fractions Treated to Date: 9
Plan Prescribed Dose Per Fraction: 1.8 Gy
Plan Total Fractions Prescribed: 12
Plan Total Prescribed Dose: 21.6 Gy
Reference Point Dosage Given to Date: 16.2 Gy
Reference Point Session Dosage Given: 1.8 Gy
Session Number: 9

## 2024-03-04 ENCOUNTER — Ambulatory Visit
Admission: RE | Admit: 2024-03-04 | Discharge: 2024-03-04 | Disposition: A | Discharge status: Home / Self Care | Source: Ambulatory Visit | Attending: Radiation Oncology | Admitting: Radiation Oncology

## 2024-03-04 ENCOUNTER — Other Ambulatory Visit: Payer: Self-pay

## 2024-03-04 DIAGNOSIS — C6291 Malignant neoplasm of right testis, unspecified whether descended or undescended: Secondary | ICD-10-CM | POA: Diagnosis not present

## 2024-03-04 LAB — RAD ONC ARIA SESSION SUMMARY
Course Elapsed Days: 11
Plan Fractions Treated to Date: 10
Plan Prescribed Dose Per Fraction: 1.8 Gy
Plan Total Fractions Prescribed: 12
Plan Total Prescribed Dose: 21.6 Gy
Reference Point Dosage Given to Date: 18 Gy
Reference Point Session Dosage Given: 1.8 Gy
Session Number: 10

## 2024-03-07 ENCOUNTER — Ambulatory Visit
Admission: RE | Admit: 2024-03-07 | Discharge: 2024-03-07 | Disposition: A | Discharge status: Home / Self Care | Source: Ambulatory Visit | Attending: Radiation Oncology | Admitting: Radiation Oncology

## 2024-03-07 ENCOUNTER — Other Ambulatory Visit: Payer: Self-pay

## 2024-03-07 DIAGNOSIS — C6291 Malignant neoplasm of right testis, unspecified whether descended or undescended: Secondary | ICD-10-CM | POA: Diagnosis not present

## 2024-03-07 LAB — RAD ONC ARIA SESSION SUMMARY
Course Elapsed Days: 14
Plan Fractions Treated to Date: 11
Plan Prescribed Dose Per Fraction: 1.8 Gy
Plan Total Fractions Prescribed: 12
Plan Total Prescribed Dose: 21.6 Gy
Reference Point Dosage Given to Date: 19.8 Gy
Reference Point Session Dosage Given: 1.8 Gy
Session Number: 11

## 2024-03-08 ENCOUNTER — Ambulatory Visit
Admission: RE | Admit: 2024-03-08 | Discharge: 2024-03-08 | Disposition: A | Source: Ambulatory Visit | Attending: Radiation Oncology

## 2024-03-08 ENCOUNTER — Other Ambulatory Visit: Payer: Self-pay

## 2024-03-08 DIAGNOSIS — C6291 Malignant neoplasm of right testis, unspecified whether descended or undescended: Secondary | ICD-10-CM | POA: Diagnosis not present

## 2024-03-08 LAB — RAD ONC ARIA SESSION SUMMARY
Course Elapsed Days: 15
Plan Fractions Treated to Date: 12
Plan Prescribed Dose Per Fraction: 1.8 Gy
Plan Total Fractions Prescribed: 12
Plan Total Prescribed Dose: 21.6 Gy
Reference Point Dosage Given to Date: 21.6 Gy
Reference Point Session Dosage Given: 1.8 Gy
Session Number: 12

## 2024-03-09 ENCOUNTER — Ambulatory Visit

## 2024-03-10 ENCOUNTER — Ambulatory Visit

## 2024-03-11 ENCOUNTER — Ambulatory Visit

## 2024-03-14 ENCOUNTER — Other Ambulatory Visit: Payer: Self-pay

## 2024-03-14 ENCOUNTER — Ambulatory Visit

## 2024-03-14 ENCOUNTER — Ambulatory Visit
Admission: RE | Admit: 2024-03-14 | Discharge: 2024-03-14 | Disposition: A | Discharge status: Home / Self Care | Source: Ambulatory Visit | Attending: Radiation Oncology | Admitting: Radiation Oncology

## 2024-03-14 ENCOUNTER — Telehealth: Payer: Self-pay

## 2024-03-14 DIAGNOSIS — C6291 Malignant neoplasm of right testis, unspecified whether descended or undescended: Secondary | ICD-10-CM | POA: Diagnosis not present

## 2024-03-14 LAB — RAD ONC ARIA SESSION SUMMARY
Course Elapsed Days: 21
Plan Fractions Treated to Date: 1
Plan Prescribed Dose Per Fraction: 1.8 Gy
Plan Total Fractions Prescribed: 5
Plan Total Prescribed Dose: 9 Gy
Reference Point Dosage Given to Date: 1.8 Gy
Reference Point Session Dosage Given: 1.8 Gy
Session Number: 13

## 2024-03-14 NOTE — Telephone Encounter (Signed)
 LM for pt regarding his FMLA form being completed, faxed, and confirmation received. No questions or concerns to be noted at this time

## 2024-03-15 ENCOUNTER — Other Ambulatory Visit: Payer: Self-pay

## 2024-03-15 ENCOUNTER — Ambulatory Visit
Admission: RE | Admit: 2024-03-15 | Discharge: 2024-03-15 | Disposition: A | Source: Ambulatory Visit | Attending: Radiation Oncology

## 2024-03-15 ENCOUNTER — Ambulatory Visit
Admission: RE | Admit: 2024-03-15 | Discharge: 2024-03-15 | Disposition: A | Discharge status: Home / Self Care | Source: Ambulatory Visit | Attending: Radiation Oncology | Admitting: Radiation Oncology

## 2024-03-15 ENCOUNTER — Ambulatory Visit

## 2024-03-15 DIAGNOSIS — C6291 Malignant neoplasm of right testis, unspecified whether descended or undescended: Secondary | ICD-10-CM | POA: Diagnosis not present

## 2024-03-15 LAB — RAD ONC ARIA SESSION SUMMARY
Course Elapsed Days: 22
Plan Fractions Treated to Date: 2
Plan Prescribed Dose Per Fraction: 1.8 Gy
Plan Total Fractions Prescribed: 5
Plan Total Prescribed Dose: 9 Gy
Reference Point Dosage Given to Date: 3.6 Gy
Reference Point Session Dosage Given: 1.8 Gy
Session Number: 14

## 2024-03-16 ENCOUNTER — Other Ambulatory Visit: Payer: Self-pay

## 2024-03-16 ENCOUNTER — Ambulatory Visit

## 2024-03-16 ENCOUNTER — Ambulatory Visit
Admission: RE | Admit: 2024-03-16 | Discharge: 2024-03-16 | Disposition: A | Discharge status: Home / Self Care | Source: Ambulatory Visit | Attending: Radiation Oncology | Admitting: Radiation Oncology

## 2024-03-16 DIAGNOSIS — C6291 Malignant neoplasm of right testis, unspecified whether descended or undescended: Secondary | ICD-10-CM | POA: Diagnosis not present

## 2024-03-16 LAB — RAD ONC ARIA SESSION SUMMARY
Course Elapsed Days: 23
Plan Fractions Treated to Date: 3
Plan Prescribed Dose Per Fraction: 1.8 Gy
Plan Total Fractions Prescribed: 5
Plan Total Prescribed Dose: 9 Gy
Reference Point Dosage Given to Date: 5.4 Gy
Reference Point Session Dosage Given: 1.8 Gy
Session Number: 15

## 2024-03-17 ENCOUNTER — Ambulatory Visit
Admission: RE | Admit: 2024-03-17 | Discharge: 2024-03-17 | Disposition: A | Discharge status: Home / Self Care | Source: Ambulatory Visit | Attending: Radiation Oncology | Admitting: Radiation Oncology

## 2024-03-17 ENCOUNTER — Other Ambulatory Visit: Payer: Self-pay

## 2024-03-17 DIAGNOSIS — C6291 Malignant neoplasm of right testis, unspecified whether descended or undescended: Secondary | ICD-10-CM | POA: Diagnosis not present

## 2024-03-17 LAB — RAD ONC ARIA SESSION SUMMARY
Course Elapsed Days: 24
Plan Fractions Treated to Date: 4
Plan Prescribed Dose Per Fraction: 1.8 Gy
Plan Total Fractions Prescribed: 5
Plan Total Prescribed Dose: 9 Gy
Reference Point Dosage Given to Date: 7.2 Gy
Reference Point Session Dosage Given: 1.8 Gy
Session Number: 16

## 2024-03-18 ENCOUNTER — Ambulatory Visit
Admission: RE | Admit: 2024-03-18 | Discharge: 2024-03-18 | Disposition: A | Discharge status: Home / Self Care | Source: Ambulatory Visit | Attending: Radiation Oncology | Admitting: Radiation Oncology

## 2024-03-18 ENCOUNTER — Ambulatory Visit
Admission: RE | Admit: 2024-03-18 | Discharge: 2024-03-18 | Disposition: A | Source: Ambulatory Visit | Attending: Radiation Oncology

## 2024-03-18 ENCOUNTER — Other Ambulatory Visit: Payer: Self-pay

## 2024-03-18 DIAGNOSIS — C6291 Malignant neoplasm of right testis, unspecified whether descended or undescended: Secondary | ICD-10-CM | POA: Diagnosis not present

## 2024-03-18 LAB — RAD ONC ARIA SESSION SUMMARY
Course Elapsed Days: 25
Plan Fractions Treated to Date: 5
Plan Prescribed Dose Per Fraction: 1.8 Gy
Plan Total Fractions Prescribed: 5
Plan Total Prescribed Dose: 9 Gy
Reference Point Dosage Given to Date: 9 Gy
Reference Point Session Dosage Given: 1.8 Gy
Session Number: 17

## 2024-03-21 NOTE — Radiation Completion Notes (Signed)
 Patient Name: Travis Pham, Travis Pham MRN: [id number] Date of Birth: 1986/04/19 Referring Physician: VYVYAN SUN, M.D. Date of Service: 2024-03-21 Radiation Oncologist: Adina Barge, M.D. [facility]                             RADIATION ONCOLOGY END OF TREATMENT NOTE     Diagnosis: C62.11 Malignant neoplasm of descended right testis Intent: Curative     ==========DELIVERED PLANS==========  First Treatment Date: 2024-02-22 Last Treatment Date: 2024-03-18   Plan Name: Abd Site: Abdomen Technique: 3D Mode: Photon Dose Per Fraction: 1.8 Gy Prescribed Dose (Delivered / Prescribed): 21.6 Gy / 21.6 Gy Prescribed Fxs (Delivered / Prescribed): 12 / 12   Plan Name: Pelvis Site: Internal Iliac Nodes Technique: 3D Mode: Photon Dose Per Fraction: 1.8 Gy Prescribed Dose (Delivered / Prescribed): 9 Gy / 9 Gy Prescribed Fxs (Delivered / Prescribed): 5 / 5     ==========ON TREATMENT VISIT DATES========== 2024-02-25, 2024-03-04, 2024-03-15, 2024-03-18     ==========UPCOMING VISITS========== 03/24/2024 [facility] EST PT 30 Tina Pauletta BROCKS, MD  03/22/2024 [facility] LAB ONLY [facility]        ==========APPENDIX - ON TREATMENT VISIT NOTES==========   See weekly On Treatment Notes in Epic for details in the Media tab (listed as Progress notes on the On Treatment Visit Dates listed above).

## 2024-03-22 ENCOUNTER — Inpatient Hospital Stay

## 2024-03-22 DIAGNOSIS — Z8547 Personal history of malignant neoplasm of testis: Secondary | ICD-10-CM | POA: Insufficient documentation

## 2024-03-22 DIAGNOSIS — C6291 Malignant neoplasm of right testis, unspecified whether descended or undescended: Secondary | ICD-10-CM

## 2024-03-22 LAB — CBC WITH DIFFERENTIAL (CANCER CENTER ONLY)
Abs Immature Granulocytes: 0 K/uL (ref 0.00–0.07)
Basophils Absolute: 0 K/uL (ref 0.0–0.1)
Basophils Relative: 1 %
Eosinophils Absolute: 0.2 K/uL (ref 0.0–0.5)
Eosinophils Relative: 7 %
HCT: 40.5 % (ref 39.0–52.0)
Hemoglobin: 14 g/dL (ref 13.0–17.0)
Immature Granulocytes: 0 %
Lymphocytes Relative: 12 %
Lymphs Abs: 0.3 K/uL — ABNORMAL LOW (ref 0.7–4.0)
MCH: 30.3 pg (ref 26.0–34.0)
MCHC: 34.6 g/dL (ref 30.0–36.0)
MCV: 87.7 fL (ref 80.0–100.0)
Monocytes Absolute: 0.4 K/uL (ref 0.1–1.0)
Monocytes Relative: 15 %
Neutro Abs: 1.7 K/uL (ref 1.7–7.7)
Neutrophils Relative %: 65 %
Platelet Count: 135 K/uL — ABNORMAL LOW (ref 150–400)
RBC: 4.62 MIL/uL (ref 4.22–5.81)
RDW: 13.2 % (ref 11.5–15.5)
WBC Count: 2.5 K/uL — ABNORMAL LOW (ref 4.0–10.5)
nRBC: 0 % (ref 0.0–0.2)

## 2024-03-22 LAB — COMPREHENSIVE METABOLIC PANEL WITH GFR
ALT: 36 U/L (ref 0–44)
AST: 30 U/L (ref 15–41)
Albumin: 4.6 g/dL (ref 3.5–5.0)
Alkaline Phosphatase: 51 U/L (ref 38–126)
Anion gap: 6 (ref 5–15)
BUN: 12 mg/dL (ref 6–20)
CO2: 30 mmol/L (ref 22–32)
Calcium: 9.2 mg/dL (ref 8.9–10.3)
Chloride: 106 mmol/L (ref 98–111)
Creatinine, Ser: 0.84 mg/dL (ref 0.61–1.24)
GFR, Estimated: >60 mL/min (ref >60)
Glucose, Bld: 109 mg/dL — ABNORMAL HIGH (ref 70–99)
Potassium: 4.2 mmol/L (ref 3.5–5.1)
Sodium: 142 mmol/L (ref 135–145)
Total Bilirubin: 0.3 mg/dL (ref 0.0–1.2)
Total Protein: 6.8 g/dL (ref 6.5–8.1)

## 2024-03-22 LAB — LACTATE DEHYDROGENASE: LDH: 151 U/L (ref 98–192)

## 2024-03-23 LAB — BETA HCG QUANT (REF LAB): hCG Quant: <1 m[IU]/mL (ref 0–3)

## 2024-03-23 LAB — AFP TUMOR MARKER: AFP, Serum, Tumor Marker: 10.2 ng/mL — ABNORMAL HIGH (ref 0.0–6.9)

## 2024-03-23 NOTE — Progress Notes (Unsigned)
 [facility] OFFICE PROGRESS NOTE  Patient Care Team: Sun, Vyvyan, MD as PCP - General (Family Medicine)  Travis Pham is a 38 y.o.male with history of hypogonadism being seen at Medical Oncology Clinic for right testicular cancer.  Patient presented with enlarged right testicular mass.  On 03/16/2023 ultrasound showed right testicle replaced by lobulated heterogeneous solid vascularized mass.  He underwent right orchiectomy on 04/22/2023.  Pathology showed pT2 seminoma, positive for LVI and with rete testis invasion.  Size was 7 cm. Discussed risk can be as high as over 30% on AS and with chemotherapy likely less than 6%. After considering options, [name] elected on surveillance after discussion.   Initial Diagnosis: pT2cN0M0 Stage IB seminoma. 03/24/2023 preop AFP was 3.2 hCG 9, and LDH 243. Postop was normal in 05/2023. Treatment: right Orchiectomy Recurrence: stage IIA pN1 <3 cm in transaxial long axis (1.4 x 2.5 cm, 1.4 x 2.0 cm, 1 x 1.5 cm) 02/22/24-03/18/24 EBRT Assessment & Plan   No orders of the defined types were placed in this encounter.    [name], MD  INTERVAL HISTORY: Patient returns for follow-up.  Oncology History   No history exists.     PHYSICAL EXAMINATION: ECOG PERFORMANCE STATUS: {CHL ONC ECOG PS:[id number]}  There were no vitals filed for this visit. There were no vitals filed for this visit.  GENERAL: alert, no distress and comfortable SKIN: skin color normal and no jaundice or bruising or petechiae on exposed skin EYES: normal, sclera clear OROPHARYNX: no exudate  NECK: No palpable mass LYMPH:  no palpable cervical, axillary lymphadenopathy  LUNGS: clear to auscultation and no wheeze or rales with normal breathing effort HEART: regular rate & rhythm  ABDOMEN: abdomen soft, non-tender and nondistended. Musculoskeletal: no edema NEURO: no focal motor/sensory deficits  Relevant data reviewed during this visit included labs.  New labs  ordered.

## 2024-03-24 ENCOUNTER — Inpatient Hospital Stay

## 2024-03-24 VITALS — BP 108/63 | HR 57 | Temp 97.5°F | Resp 17 | Ht 66.34 in | Wt 141.6 lb

## 2024-03-24 DIAGNOSIS — R978 Other abnormal tumor markers: Secondary | ICD-10-CM | POA: Diagnosis not present

## 2024-03-24 DIAGNOSIS — C6291 Malignant neoplasm of right testis, unspecified whether descended or undescended: Secondary | ICD-10-CM

## 2024-03-24 DIAGNOSIS — Z8547 Personal history of malignant neoplasm of testis: Secondary | ICD-10-CM | POA: Diagnosis not present

## 2024-03-24 NOTE — Assessment & Plan Note (Addendum)
 Slightly elevated AFP.  Repeat in 4 weeks

## 2024-03-24 NOTE — Assessment & Plan Note (Addendum)
Follow up in about 4 weeks.

## 2024-04-18 ENCOUNTER — Inpatient Hospital Stay

## 2024-04-18 DIAGNOSIS — Z8547 Personal history of malignant neoplasm of testis: Secondary | ICD-10-CM | POA: Diagnosis present

## 2024-04-18 DIAGNOSIS — Z9079 Acquired absence of other genital organ(s): Secondary | ICD-10-CM | POA: Insufficient documentation

## 2024-04-18 DIAGNOSIS — C6291 Malignant neoplasm of right testis, unspecified whether descended or undescended: Secondary | ICD-10-CM

## 2024-04-18 LAB — CBC WITH DIFFERENTIAL (CANCER CENTER ONLY)
Abs Immature Granulocytes: 0.01 K/uL (ref 0.00–0.07)
Basophils Absolute: 0 K/uL (ref 0.0–0.1)
Basophils Relative: 1 %
Eosinophils Absolute: 0.1 K/uL (ref 0.0–0.5)
Eosinophils Relative: 4 %
HCT: 39 % (ref 39.0–52.0)
Hemoglobin: 13.1 g/dL (ref 13.0–17.0)
Immature Granulocytes: 0 %
Lymphocytes Relative: 36 %
Lymphs Abs: 1.2 K/uL (ref 0.7–4.0)
MCH: 29.7 pg (ref 26.0–34.0)
MCHC: 33.6 g/dL (ref 30.0–36.0)
MCV: 88.4 fL (ref 80.0–100.0)
Monocytes Absolute: 0.5 K/uL (ref 0.1–1.0)
Monocytes Relative: 14 %
Neutro Abs: 1.5 K/uL — ABNORMAL LOW (ref 1.7–7.7)
Neutrophils Relative %: 45 %
Platelet Count: 179 K/uL (ref 150–400)
RBC: 4.41 MIL/uL (ref 4.22–5.81)
RDW: 14.2 % (ref 11.5–15.5)
WBC Count: 3.3 K/uL — ABNORMAL LOW (ref 4.0–10.5)
nRBC: 0 % (ref 0.0–0.2)

## 2024-04-18 LAB — CMP (CANCER CENTER ONLY)
ALT: 35 U/L (ref 0–44)
AST: 28 U/L (ref 15–41)
Albumin: 4.4 g/dL (ref 3.5–5.0)
Alkaline Phosphatase: 56 U/L (ref 38–126)
Anion gap: 6 (ref 5–15)
BUN: 11 mg/dL (ref 6–20)
CO2: 28 mmol/L (ref 22–32)
Calcium: 9.1 mg/dL (ref 8.9–10.3)
Chloride: 106 mmol/L (ref 98–111)
Creatinine: 0.98 mg/dL (ref 0.61–1.24)
GFR, Estimated: >60 mL/min (ref >60)
Glucose, Bld: 114 mg/dL — ABNORMAL HIGH (ref 70–99)
Potassium: 4.4 mmol/L (ref 3.5–5.1)
Sodium: 140 mmol/L (ref 135–145)
Total Bilirubin: 0.4 mg/dL (ref 0.0–1.2)
Total Protein: 6.6 g/dL (ref 6.5–8.1)

## 2024-04-18 LAB — LACTATE DEHYDROGENASE: LDH: 148 U/L (ref 105–235)

## 2024-04-18 NOTE — Progress Notes (Unsigned)
 [facility] OFFICE PROGRESS NOTE  Patient Care Team: Sun, Vyvyan, MD as PCP - General (Family Medicine)   Travis Pham is a 38 y.o.male with history of hypogonadism being [facility] right testicular cancer.  Patient presented with enlarged right testicular mass.  On 03/16/2023 ultrasound showed right testicle replaced by lobulated heterogeneous solid vascularized mass.  He underwent right orchiectomy on 04/22/2023.  Pathology showed pT2 seminoma, positive for LVI and with rete testis invasion.  Size was 7 cm. Discussed risk can be as high as over 30% on AS and with chemotherapy likely less than 6%. After considering options, [name] elected on surveillance after discussion.     He developed recurrence in 2025.  He has completed radiation.   Initial Diagnosis: [id number] Stage IB seminoma. 03/24/2023 preop AFP was 3.2 hCG 9, and LDH 243. Postop was normal in 05/2023. Treatment: right Orchiectomy Recurrence: stage IIA pN1 <3 cm in transaxial long axis (1.4 x 2.5 cm, 1.4 x 2.0 cm, 1 x 1.5 cm) 02/22/24-03/18/24 EBRT  Overall recovering well.  Normalization of tumor markers.  Borderline leukopenia expected from radiation Assessment & Plan Cancer, testis, seminoma, right [facility]) Repeat tumor markers, lab with CT scan in 4 months and then follow-up Lab and CT scan on 3/16 and see me on 3/20 Call us  if new concerning symptoms.  Orders Placed This Encounter  Procedures   CT CHEST ABDOMEN PELVIS W CONTRAST    Standing Status:   Future    Expected Date:   08/15/2024    Expiration Date:   04/19/2025    If indicated for the ordered procedure, I authorize the administration of contrast media per Radiology protocol:   Yes    Does the patient have a contrast media/X-ray dye allergy?:   No    Preferred imaging location?:   [facility]    If indicated for the ordered procedure, I authorize the administration of oral contrast media per Radiology protocol:   Yes      [name], MD  INTERVAL HISTORY: Patient returns for follow-up. Overall feeling better no palpable inguinal lymphadenopathy.    PHYSICAL EXAMINATION: ECOG PERFORMANCE STATUS: 0  Vitals:   04/19/24 1554  BP: 131/75  Pulse: (!) 56  Resp: 18  Temp: (!) 97.5 F (36.4 C)  SpO2: 100%   Filed Weights   04/19/24 1554  Weight: 148 lb 1.6 oz (67.2 kg)    GENERAL: alert, no distress and comfortable   Relevant data reviewed during this visit included labs.  New labs and imaging ordered.

## 2024-04-19 ENCOUNTER — Inpatient Hospital Stay

## 2024-04-19 VITALS — BP 131/75 | HR 56 | Temp 97.5°F | Resp 18 | Wt 148.1 lb

## 2024-04-19 DIAGNOSIS — Z8547 Personal history of malignant neoplasm of testis: Secondary | ICD-10-CM | POA: Diagnosis not present

## 2024-04-19 DIAGNOSIS — C6291 Malignant neoplasm of right testis, unspecified whether descended or undescended: Secondary | ICD-10-CM | POA: Diagnosis not present

## 2024-04-19 LAB — AFP TUMOR MARKER: AFP, Serum, Tumor Marker: 3.5 ng/mL (ref 0.0–6.9)

## 2024-04-19 LAB — BETA HCG QUANT (REF LAB): hCG Quant: <1 m[IU]/mL (ref 0–3)

## 2024-04-19 NOTE — Assessment & Plan Note (Signed)
 Repeat tumor markers, lab with CT scan in 4 months and then follow-up Lab and CT scan on 3/16 and see me on 3/20 Call us  if new concerning symptoms.

## 2024-08-09 ENCOUNTER — Inpatient Hospital Stay

## 2024-08-12 ENCOUNTER — Inpatient Hospital Stay
